# Patient Record
Sex: Female | Born: 1965 | Race: White | Hispanic: No | Marital: Married | Smoking: Former smoker
Health system: Southern US, Community
[De-identification: ages and names within clinical notes are randomized; demographics above are authoritative.]

## PROBLEM LIST (undated history)

## (undated) DIAGNOSIS — F32A Depression, unspecified: Secondary | ICD-10-CM

## (undated) DIAGNOSIS — Z1371 Encounter for nonprocreative screening for genetic disease carrier status: Secondary | ICD-10-CM

## (undated) DIAGNOSIS — F329 Major depressive disorder, single episode, unspecified: Secondary | ICD-10-CM

## (undated) DIAGNOSIS — N938 Other specified abnormal uterine and vaginal bleeding: Secondary | ICD-10-CM

## (undated) DIAGNOSIS — Z9189 Other specified personal risk factors, not elsewhere classified: Secondary | ICD-10-CM

## (undated) DIAGNOSIS — D219 Benign neoplasm of connective and other soft tissue, unspecified: Secondary | ICD-10-CM

## (undated) DIAGNOSIS — Z9289 Personal history of other medical treatment: Secondary | ICD-10-CM

## (undated) DIAGNOSIS — L309 Dermatitis, unspecified: Secondary | ICD-10-CM

## (undated) DIAGNOSIS — E559 Vitamin D deficiency, unspecified: Secondary | ICD-10-CM

## (undated) DIAGNOSIS — Z803 Family history of malignant neoplasm of breast: Secondary | ICD-10-CM

## (undated) DIAGNOSIS — N946 Dysmenorrhea, unspecified: Secondary | ICD-10-CM

## (undated) DIAGNOSIS — F419 Anxiety disorder, unspecified: Secondary | ICD-10-CM

## (undated) DIAGNOSIS — N920 Excessive and frequent menstruation with regular cycle: Secondary | ICD-10-CM

## (undated) HISTORY — DX: Dermatitis, unspecified: L30.9

## (undated) HISTORY — PX: TONSILLECTOMY: SUR1361

## (undated) HISTORY — DX: Benign neoplasm of connective and other soft tissue, unspecified: D21.9

## (undated) HISTORY — DX: Encounter for nonprocreative screening for genetic disease carrier status: Z13.71

## (undated) HISTORY — DX: Dysmenorrhea, unspecified: N94.6

## (undated) HISTORY — DX: Excessive and frequent menstruation with regular cycle: N92.0

## (undated) HISTORY — DX: Personal history of other medical treatment: Z92.89

## (undated) HISTORY — DX: Family history of malignant neoplasm of breast: Z80.3

---
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
## 1999-07-16 ENCOUNTER — Emergency Department (HOSPITAL_COMMUNITY): Admission: EM | Admit: 1999-07-16 | Discharge: 1999-07-16 | Payer: Self-pay | Admitting: Emergency Medicine

## 1999-07-16 ENCOUNTER — Encounter: Payer: Self-pay | Admitting: Emergency Medicine

## 1999-07-30 ENCOUNTER — Other Ambulatory Visit: Admission: RE | Admit: 1999-07-30 | Discharge: 1999-07-30 | Payer: Self-pay | Admitting: Obstetrics and Gynecology

## 2007-11-08 ENCOUNTER — Ambulatory Visit: Payer: Self-pay | Admitting: Internal Medicine

## 2008-04-16 ENCOUNTER — Emergency Department: Payer: Self-pay | Admitting: Unknown Physician Specialty

## 2008-05-22 ENCOUNTER — Ambulatory Visit: Payer: Self-pay | Admitting: Obstetrics and Gynecology

## 2008-05-29 ENCOUNTER — Ambulatory Visit: Payer: Self-pay | Admitting: Obstetrics and Gynecology

## 2008-05-29 HISTORY — PX: DILATION AND CURETTAGE OF UTERUS: SHX78

## 2010-02-19 ENCOUNTER — Emergency Department: Payer: Self-pay | Admitting: Emergency Medicine

## 2011-04-19 ENCOUNTER — Ambulatory Visit: Payer: Self-pay

## 2012-02-27 DIAGNOSIS — L309 Dermatitis, unspecified: Secondary | ICD-10-CM

## 2012-02-27 HISTORY — DX: Dermatitis, unspecified: L30.9

## 2012-12-05 DIAGNOSIS — Z1371 Encounter for nonprocreative screening for genetic disease carrier status: Secondary | ICD-10-CM

## 2012-12-05 HISTORY — DX: Encounter for nonprocreative screening for genetic disease carrier status: Z13.71

## 2013-04-04 DIAGNOSIS — Z803 Family history of malignant neoplasm of breast: Secondary | ICD-10-CM

## 2013-04-04 HISTORY — DX: Family history of malignant neoplasm of breast: Z80.3

## 2013-05-01 HISTORY — PX: INTRAUTERINE DEVICE (IUD) INSERTION: SHX5877

## 2014-05-13 DIAGNOSIS — Z9289 Personal history of other medical treatment: Secondary | ICD-10-CM

## 2014-05-13 HISTORY — DX: Personal history of other medical treatment: Z92.89

## 2015-01-14 ENCOUNTER — Ambulatory Visit: Payer: Self-pay | Admitting: Nurse Practitioner

## 2015-09-17 ENCOUNTER — Encounter: Payer: Self-pay | Admitting: *Deleted

## 2015-09-18 ENCOUNTER — Encounter
Admission: RE | Disposition: A | Discharge status: Home / Self Care | Payer: Self-pay | Source: Ambulatory Visit | Attending: Gastroenterology

## 2015-09-18 ENCOUNTER — Ambulatory Visit: Payer: Commercial Managed Care - HMO | Admitting: Anesthesiology

## 2015-09-18 ENCOUNTER — Ambulatory Visit
Admission: RE | Admit: 2015-09-18 | Discharge: 2015-09-18 | Disposition: A | Discharge status: Home / Self Care | Payer: Commercial Managed Care - HMO | Source: Ambulatory Visit | Attending: Gastroenterology | Admitting: Gastroenterology

## 2015-09-18 DIAGNOSIS — F329 Major depressive disorder, single episode, unspecified: Secondary | ICD-10-CM | POA: Diagnosis not present

## 2015-09-18 DIAGNOSIS — Z87891 Personal history of nicotine dependence: Secondary | ICD-10-CM | POA: Insufficient documentation

## 2015-09-18 DIAGNOSIS — E559 Vitamin D deficiency, unspecified: Secondary | ICD-10-CM | POA: Insufficient documentation

## 2015-09-18 DIAGNOSIS — Z79899 Other long term (current) drug therapy: Secondary | ICD-10-CM | POA: Insufficient documentation

## 2015-09-18 DIAGNOSIS — Z1211 Encounter for screening for malignant neoplasm of colon: Secondary | ICD-10-CM | POA: Diagnosis not present

## 2015-09-18 DIAGNOSIS — Z881 Allergy status to other antibiotic agents status: Secondary | ICD-10-CM | POA: Diagnosis not present

## 2015-09-18 DIAGNOSIS — F419 Anxiety disorder, unspecified: Secondary | ICD-10-CM | POA: Diagnosis not present

## 2015-09-18 DIAGNOSIS — K5730 Diverticulosis of large intestine without perforation or abscess without bleeding: Secondary | ICD-10-CM | POA: Insufficient documentation

## 2015-09-18 DIAGNOSIS — I10 Essential (primary) hypertension: Secondary | ICD-10-CM | POA: Diagnosis not present

## 2015-09-18 HISTORY — DX: Other specified abnormal uterine and vaginal bleeding: N93.8

## 2015-09-18 HISTORY — DX: Anxiety disorder, unspecified: F41.9

## 2015-09-18 HISTORY — DX: Depression, unspecified: F32.A

## 2015-09-18 HISTORY — PX: COLONOSCOPY WITH PROPOFOL: SHX5780

## 2015-09-18 HISTORY — DX: Major depressive disorder, single episode, unspecified: F32.9

## 2015-09-18 HISTORY — DX: Vitamin D deficiency, unspecified: E55.9

## 2015-09-18 LAB — POCT PREGNANCY, URINE: PREG TEST UR: NEGATIVE

## 2015-09-18 SURGERY — COLONOSCOPY WITH PROPOFOL
Anesthesia: General

## 2015-09-18 MED ORDER — LIDOCAINE HCL (CARDIAC) 20 MG/ML IV SOLN
INTRAVENOUS | Status: DC | PRN
  Administered 2015-09-18: 80 mg via INTRAVENOUS

## 2015-09-18 MED ORDER — PROPOFOL 500 MG/50ML IV EMUL
INTRAVENOUS | Status: DC | PRN
  Administered 2015-09-18: 160 ug/kg/min via INTRAVENOUS

## 2015-09-18 MED ORDER — MIDAZOLAM HCL 2 MG/2ML IJ SOLN
INTRAMUSCULAR | Status: DC | PRN
  Administered 2015-09-18: 2 mg via INTRAVENOUS

## 2015-09-18 MED ORDER — SODIUM CHLORIDE 0.9 % IV SOLN
INTRAVENOUS | Status: DC
  Administered 2015-09-18: 1000 mL via INTRAVENOUS

## 2015-09-18 NOTE — Anesthesia Postprocedure Evaluation (Signed)
  Anesthesia Post-op Note  Patient: Julie White  Procedure(s) Performed: Procedure(s): COLONOSCOPY WITH PROPOFOL (N/A)  Anesthesia type:General  Patient location: PACU  Post pain: Pain level controlled  Post assessment: Post-op Vital signs reviewed, Patient's Cardiovascular Status Stable, Respiratory Function Stable, Patent Airway and No signs of Nausea or vomiting  Post vital signs: Reviewed and stable  Last Vitals:  Filed Vitals:   09/18/15 1006  BP: 146/81  Pulse: 72  Temp:   Resp: 19    Level of consciousness: awake, alert  and patient cooperative  Complications: No apparent anesthesia complications

## 2015-09-18 NOTE — Discharge Instructions (Signed)

## 2015-09-18 NOTE — Transfer of Care (Signed)
Immediate Anesthesia Transfer of Care Note  Patient: Julie White  Procedure(s) Performed: Procedure(s): COLONOSCOPY WITH PROPOFOL (N/A)  Patient Location: PACU and Endoscopy Unit  Anesthesia Type:General  Level of Consciousness: sedated  Airway & Oxygen Therapy: Patient Spontanous Breathing and Patient connected to nasal cannula oxygen  Post-op Assessment: Report given to RN and Post -op Vital signs reviewed and stable  Post vital signs: Reviewed  Last Vitals:  Filed Vitals:   09/18/15 0819  BP: 141/79  Pulse: 82  Temp: 37.2 C  Resp: 18    Complications: No apparent anesthesia complications

## 2015-09-18 NOTE — Op Note (Signed)
[facility] Gastroenterology Patient Name: Julie White Procedure Date: 09/18/2015 8:58 AM MRN: [id number] Account #: [id number] Date of Birth: January 03, 1966 Admit Type: Outpatient Age: 49 Room: [facility] ENDO ROOM 1 Gender: Female Note Status: Finalized Procedure:         Colonoscopy Indications:       Screening for colorectal malignant neoplasm, This is the                     patient's first colonoscopy Patient Profile:   This is a 49 year old female. Providers:         Gerrit Heck. Rayann Heman, MD Referring MD:      Willaim Bane. Copland Jaclyn Prime, MD (Referring MD) Medicines:         Propofol per Anesthesia Complications:     No immediate complications. Procedure:         Pre-Anesthesia Assessment:                    - Prior to the procedure, a History and Physical was                     performed, and patient medications, allergies and                     sensitivities were reviewed. The patient's tolerance of                     previous anesthesia was reviewed.                    After obtaining informed consent, the colonoscope was                     passed under direct vision. Throughout the procedure, the                     patient's blood pressure, pulse, and oxygen saturations                     were monitored continuously. The Olympus CF-Q160AL                     colonoscope (S#. [id number]) was introduced through the anus                     and advanced to the the cecum, identified by appendiceal                     orifice and ileocecal valve. The colonoscopy was somewhat                     difficult due to multiple diverticula in the colon.                     Successful completion of the procedure was aided by                     withdrawing the scope and replacing with the pediatric                     colonoscope. The patient tolerated the procedure well. The                     quality of the bowel preparation was excellent. Findings:      The  perianal and  digital rectal examinations were normal.      Many small and large-mouthed diverticula were found in the sigmoid colon.      The exam was otherwise without abnormality on direct and retroflexion       views. Impression:        - Diverticulosis in the sigmoid colon.                    - The examination was otherwise normal on direct and                     retroflexion views.                    - No specimens collected. Recommendation:    - Observe patient in GI recovery unit.                    - High fiber diet.                    - Continue present medications.                    - Repeat colonoscopy in 10 years for screening purposes.                    - Return to referring physician.                    - The findings and recommendations were discussed with the                     patient.                    - The findings and recommendations were discussed with the                     patient's family. Procedure Code(s): --- Professional ---                    [phone], Colonoscopy, flexible; diagnostic, including                     collection of specimen(s) by brushing or washing, when                     performed (separate procedure) CPT copyright 2014 American Medical Association. All rights reserved. The codes documented in this report are preliminary and upon coder review may  be revised to meet current compliance requirements. Mellody Life, MD 09/18/2015 9:35:26 AM This report has been signed electronically. Number of Addenda: 0 Note Initiated On: 09/18/2015 8:58 AM Scope Withdrawal Time: 0 hours 10 minutes 3 seconds  Total Procedure Duration: 0 hours 22 minutes 18 seconds       [facility]

## 2015-09-18 NOTE — H&P (Signed)
  Primary Care Physician:  No PCP Per Patient  Pre-Procedure History & Physical: HPI:  Chanler S Mcadam is a 49 y.o. female is here for an colonoscopy.   Past Medical History  Diagnosis Date  . Anxiety   . Depression   . Vitamin D deficiency   . Dysfunctional uterine bleeding     Past Surgical History  Procedure Laterality Date  . Dilation and curettage of uterus    . Tonsillectomy      Prior to Admission medications   Medication Sig Start Date End Date Taking? Authorizing Provider  Cholecalciferol (VITAMIN D3) 2000 UNITS TABS Take 2,000 Units by mouth daily.   Yes Historical Provider, MD  citalopram (CELEXA) 20 MG tablet Take 20 mg by mouth daily. Two 20 mg. Tablets daily   Yes Historical Provider, MD  hydrochlorothiazide (HYDRODIURIL) 12.5 MG tablet Take 12.5 mg by mouth daily.   Yes Historical Provider, MD  Naltrexone-Bupropion HCl ER 8-90 MG TB12 Take 8-90 mg by mouth 2 (two) times daily. Take 2 tablets by mouth bid   Yes Historical Provider, MD  norethindrone (MICRONOR,CAMILA,ERRIN) 0.35 MG tablet Take 1 tablet by mouth daily.   Yes Historical Provider, MD    Allergies as of 08/11/2015  . (Not on File)    History reviewed. No pertinent family history.  Social History   Social History  . Marital Status: Married    Spouse Name: N/A  . Number of Children: N/A  . Years of Education: N/A   Occupational History  . Not on file.   Social History Main Topics  . Smoking status: Former Research scientist (life sciences)  . Smokeless tobacco: Not on file  . Alcohol Use: Not on file  . Drug Use: Not on file  . Sexual Activity: Not on file   Other Topics Concern  . Not on file   Social History Narrative     Physical Exam: BP 141/79 mmHg  Pulse 82  Temp(Src) 98.9 F (37.2 C) (Tympanic)  Resp 18  Ht 5\' 6"  (1.676 m)  Wt 113.399 kg (250 lb)  BMI 40.37 kg/m2  SpO2 97% General:   Alert,  pleasant and cooperative in NAD Head:  Normocephalic and atraumatic. Neck:  Supple; no masses or  thyromegaly. Lungs:  Clear throughout to auscultation.    Heart:  Regular rate and rhythm. Abdomen:  Soft, nontender and nondistended. Normal bowel sounds, without guarding, and without rebound.   Neurologic:  Alert and  oriented x4;  grossly normal neurologically.  Impression/Plan: Leisl S Harshfield is here for an colonoscopy to be performed for screening  Risks, benefits, limitations, and alternatives regarding  colonoscopy have been reviewed with the patient.  Questions have been answered.  All parties agreeable.   Josefine Class, MD  09/18/2015, 8:56 AM

## 2015-09-18 NOTE — Anesthesia Preprocedure Evaluation (Signed)
Anesthesia Evaluation  Patient identified by MRN, date of birth, ID band Patient awake    Reviewed: Allergy & Precautions, NPO status , Patient's Chart, lab work & pertinent test results  History of Anesthesia Complications Negative for: history of anesthetic complications  Airway Mallampati: III       Dental  (+) Teeth Intact   Pulmonary neg pulmonary ROS, former smoker,           Cardiovascular hypertension, Pt. on medications negative cardio ROS       Neuro/Psych negative neurological ROS     GI/Hepatic negative GI ROS, Neg liver ROS,   Endo/Other  negative endocrine ROS  Renal/GU negative Renal ROS     Musculoskeletal   Abdominal   Peds  Hematology negative hematology ROS (+)   Anesthesia Other Findings   Reproductive/Obstetrics                             Anesthesia Physical Anesthesia Plan  ASA: II  Anesthesia Plan: General   Post-op Pain Management:    Induction: Intravenous  Airway Management Planned: Nasal Cannula  Additional Equipment:   Intra-op Plan:   Post-operative Plan:   Informed Consent: I have reviewed the patients History and Physical, chart, labs and discussed the procedure including the risks, benefits and alternatives for the proposed anesthesia with the patient or authorized representative who has indicated his/her understanding and acceptance.     Plan Discussed with:   Anesthesia Plan Comments:         Anesthesia Quick Evaluation

## 2015-09-23 ENCOUNTER — Encounter: Payer: Self-pay | Admitting: Gastroenterology

## 2016-12-16 ENCOUNTER — Ambulatory Visit: Admission: EM | Admit: 2016-12-16 | Discharge: 2016-12-16 | Discharge status: Absent without leave | Payer: 59

## 2017-02-01 ENCOUNTER — Ambulatory Visit (INDEPENDENT_AMBULATORY_CARE_PROVIDER_SITE_OTHER): Payer: 59 | Admitting: Obstetrics and Gynecology

## 2017-02-01 ENCOUNTER — Encounter: Payer: Self-pay | Admitting: Obstetrics and Gynecology

## 2017-02-01 VITALS — BP 120/78 | Ht 66.0 in | Wt 254.0 lb

## 2017-02-01 DIAGNOSIS — Z713 Dietary counseling and surveillance: Secondary | ICD-10-CM | POA: Diagnosis not present

## 2017-02-01 DIAGNOSIS — Z6841 Body Mass Index (BMI) 40.0 and over, adult: Secondary | ICD-10-CM

## 2017-02-01 DIAGNOSIS — IMO0001 Reserved for inherently not codable concepts without codable children: Secondary | ICD-10-CM | POA: Insufficient documentation

## 2017-02-01 DIAGNOSIS — E6609 Other obesity due to excess calories: Secondary | ICD-10-CM | POA: Diagnosis not present

## 2017-02-01 DIAGNOSIS — I10 Essential (primary) hypertension: Secondary | ICD-10-CM

## 2017-02-01 MED ORDER — NALTREXONE-BUPROPION HCL ER 8-90 MG PO TB12: 8.0000 mg | ORAL_TABLET | Freq: Two times a day (BID) | ORAL | 0 refills | Status: DC

## 2017-02-01 MED ORDER — HYDROCHLOROTHIAZIDE 12.5 MG PO TABS: 12.5000 mg | ORAL_TABLET | Freq: Every day | ORAL | 3 refills | Status: DC

## 2017-02-01 NOTE — Progress Notes (Signed)
HPI:  Pt here to f/u on BP from 01/03/17 appt. BP was 142/92 at that visit. She was out of Rx HCTZ  RF and was taking med sparingly. She has restarted HCTZ daily and doing well without side effects. Her CMP was WNL at that visit.  Pt also here for wt check from 01/03/17. Wt was 260#.  Pt restarted contrave at that appt. She did it in the past without side effects. She is doing well with it. She denies any psych changes. She has lost 6#. She has decreased appetite. She has not started exercise yet but is trying to eat healthier. She would like to cont Rx. She had mild pre-DM (HgA1C 5.7%) 01/03/17. She has considered bariatric surg.  She is taking Vit D supp.  Review of Systems  Constitutional: Positive for weight loss. Negative for fever.  Neurological: Negative for dizziness and headaches.  Psychiatric/Behavioral: Negative for depression and suicidal ideas. The patient is not nervous/anxious.    Vitals:   BP 120/78 (BP Location: Right Arm, Patient Position: Sitting)   Ht 5\' 6"  (1.676 m)   Wt 254 lb (115.2 kg)   BMI 41.00 kg/m    Assessment/Plan:  Essential hypertension - BPs controlled with HCTZ. Rx RF for 1 yr. F/u in 3 months. - Plan: Comprehensive metabolic panel  Class 3 obesity due to excess calories without serious comorbidity with body mass index (BMI) of 40.0 to 44.9 in adult [facility]) - Plan: Comprehensive metabolic panel, Hemoglobin A1c  Encounter for weight loss counseling - Pt doing well with contrave. Rx RF. Add exercise/diet changes. Rechk labs at 3 mo f/u. - Plan: Hemoglobin A1c  Return in about 3 months (around 05/01/2017) for wt and BP check.  And labs.  Meds ordered this encounter  Medications  . hydrochlorothiazide (HYDRODIURIL) 12.5 MG tablet    Sig: Take 1 tablet (12.5 mg total) by mouth daily.    Dispense:  90 tablet    Refill:  3  . Naltrexone-Bupropion HCl ER 8-90 MG TB12    Sig: Take 8-90 mg by mouth 2 (two) times daily. Take 2 tablets by mouth bid    Dispense:   180 tablet    Refill:  0

## 2017-02-06 ENCOUNTER — Other Ambulatory Visit: Payer: Self-pay | Admitting: Obstetrics and Gynecology

## 2017-02-06 MED ORDER — NALTREXONE-BUPROPION HCL ER 8-90 MG PO TB12: 8.0000 mg | ORAL_TABLET | Freq: Two times a day (BID) | ORAL | 0 refills | Status: DC

## 2017-02-08 ENCOUNTER — Other Ambulatory Visit: Payer: Self-pay | Admitting: Obstetrics and Gynecology

## 2017-02-08 ENCOUNTER — Telehealth: Payer: Self-pay | Admitting: Obstetrics and Gynecology

## 2017-02-08 MED ORDER — NALTREXONE-BUPROPION HCL ER 8-90 MG PO TB12: 8.0000 mg | ORAL_TABLET | Freq: Two times a day (BID) | ORAL | 0 refills | Status: DC

## 2017-02-08 NOTE — Telephone Encounter (Signed)
Patient called and stated that her Contrave needs to be called in to [facility] [place], due to she has a discount card and Mail order will not honor it.  Please advise patient at CB# [phone]

## 2017-05-04 ENCOUNTER — Ambulatory Visit: Payer: 59 | Admitting: Obstetrics and Gynecology

## 2017-12-28 ENCOUNTER — Other Ambulatory Visit: Payer: Self-pay | Admitting: Obstetrics and Gynecology

## 2018-03-18 ENCOUNTER — Other Ambulatory Visit: Payer: Self-pay | Admitting: Obstetrics and Gynecology

## 2019-03-10 ENCOUNTER — Encounter: Payer: Self-pay | Admitting: Obstetrics and Gynecology

## 2019-03-11 ENCOUNTER — Other Ambulatory Visit: Payer: Self-pay | Admitting: Obstetrics and Gynecology

## 2019-03-12 ENCOUNTER — Other Ambulatory Visit: Payer: Self-pay | Admitting: Obstetrics and Gynecology

## 2019-03-12 MED ORDER — CITALOPRAM HYDROBROMIDE 40 MG PO TABS: 40.0000 mg | ORAL_TABLET | Freq: Every day | ORAL | 0 refills | Status: DC

## 2019-03-12 NOTE — Progress Notes (Signed)
Rx RF citalopram until 6/20 annual.

## 2019-04-04 ENCOUNTER — Other Ambulatory Visit: Payer: Self-pay | Admitting: Obstetrics and Gynecology

## 2019-05-04 ENCOUNTER — Other Ambulatory Visit: Payer: Self-pay | Admitting: Obstetrics and Gynecology

## 2019-05-06 DIAGNOSIS — Z9189 Other specified personal risk factors, not elsewhere classified: Secondary | ICD-10-CM

## 2019-05-06 HISTORY — DX: Other specified personal risk factors, not elsewhere classified: Z91.89

## 2019-05-07 ENCOUNTER — Encounter: Payer: Self-pay | Admitting: Obstetrics and Gynecology

## 2019-05-07 NOTE — Patient Instructions (Signed)
I value your feedback and entrusting us with your care. If you get a [facility] patient survey, I would appreciate you taking the time to let us know about your experience today. Thank you! 

## 2019-05-07 NOTE — Progress Notes (Signed)
PCP: Patient, No Pcp Per   Chief Complaint  Patient presents with  . Gynecologic Exam    would like advise/refer to someone regarding her right arm, cannot straighten it all the way up, hurts to lay on it x few months    HPI:      Ms. Julie White is a 53 y.o. G0P0000 who LMP was Patient's last menstrual period was 05/22/2018 (approximate)., presents today for her annual examination.  Her menses are absent due to perimenopause. LMP about 11-12 months ago. She does not have vasomotor sx.   Sex activity: single partner, contraception - post menopausal status. She does have vaginal dryness and uses lubricants with relief.  Last Pap: January 03, 2017  Results were: no abnormalities /neg HPV DNA.  Hx of STDs: none  Last mammogram: May 18, 2015  Results were: normal--routine follow-up in 12 months There is a FH of breast cancer in 2 mat aunts and 3 mat cousins. Pt is BRCA/BART neg 2014, IBIS=19%. Update testing discussed and declined in past. There is no FH of ovarian cancer. The patient does do self-breast exams.  Colonoscopy: 2016 without abnormalities;  Repeat due after 10 years.   Tobacco use: The patient denies current or previous tobacco use. Alcohol use: social drinker Exercise: not active  She does get adequate calcium and Vitamin D in her diet.  Has anxiety and takes citalopram 40 mg daily with sx relief. Has work/fam stressors. Also has a hx of HTN and took HCTZ in past, but hasn't been taking recently and BP has been normal. Pt checks Bps at home.   No recent labs.   Pt has limited ROM and pain in RT shoulder for several months. Sx started after swinging a beach bag over her shoulder. She noticed a "catch" and then it hurt for a few days, but never returned to normal. Sx wax and wane. Hurts to sleep on either side and that worsens shoulder sx.   Past Medical History:  Diagnosis Date  . Anxiety   . BRCA negative 2014   BRCA/BART neg through [facility]  . Depression   .  Dysfunctional uterine bleeding   . Dysmenorrhea   . Eczema 02/27/2012  . Family history of breast cancer 04/2013   BRCA neg; IBIS-19%  . Fibroid   . History of mammogram 05/15/2014 BI-RADS II  . History of Papanicolaou smear of cervix 05/13/2014  . Menorrhagia   . Vitamin D deficiency     Past Surgical History:  Procedure Laterality Date  . COLONOSCOPY WITH PROPOFOL N/A 09/18/2015   Procedure: COLONOSCOPY WITH PROPOFOL;  Surgeon: Josefine Class, MD;  Location: [facility] ENDOSCOPY;  Service: Endoscopy;  Laterality: N/A;  . DILATION AND CURETTAGE OF UTERUS  05/29/2008   DUB; FIBROIDS  . INTRAUTERINE DEVICE (IUD) INSERTION  05/01/2013   LAW  . TONSILLECTOMY      Family History  Problem Relation Age of Onset  . Parkinson's disease Father   . Diabetes Brother   . Breast cancer Maternal Aunt   . Breast cancer Maternal Aunt   . Breast cancer Cousin   . Breast cancer Cousin   . Breast cancer Cousin   . Hypertension Neg Hx   . Cancer Neg Hx     Social History   Socioeconomic History  . Marital status: Married    Spouse name: Not on file  . Number of children: Not on file  . Years of education: Not on file  . Highest education level:  Not on file  Occupational History  . Not on file  Social Needs  . Financial resource strain: Not on file  . Food insecurity:    Worry: Not on file    Inability: Not on file  . Transportation needs:    Medical: Not on file    Non-medical: Not on file  Tobacco Use  . Smoking status: Former Research scientist (life sciences)  . Smokeless tobacco: Never Used  Substance and Sexual Activity  . Alcohol use: Yes    Comment: occ  . Drug use: No  . Sexual activity: Yes    Birth control/protection: None  Lifestyle  . Physical activity:    Days per week: Not on file    Minutes per session: Not on file  . Stress: Not on file  Relationships  . Social connections:    Talks on phone: Not on file    Gets together: Not on file    Attends religious service: Not on file     Active member of club or organization: Not on file    Attends meetings of clubs or organizations: Not on file    Relationship status: Not on file  . Intimate partner violence:    Fear of current or ex partner: Not on file    Emotionally abused: Not on file    Physically abused: Not on file    Forced sexual activity: Not on file  Other Topics Concern  . Not on file  Social History Narrative  . Not on file    Current Outpatient Medications on File Prior to Visit  Medication Sig Dispense Refill  . Cholecalciferol (VITAMIN D3) 2000 UNITS TABS Take 2,000 Units by mouth daily.    . hydrochlorothiazide (HYDRODIURIL) 12.5 MG tablet Take 1 tablet (12.5 mg total) by mouth daily. (Patient not taking: Reported on 05/08/2019) 90 tablet 3   No current facility-administered medications on file prior to visit.     ROS:  Review of Systems  Constitutional: Negative for fatigue, fever and unexpected weight change.  Respiratory: Negative for cough, shortness of breath and wheezing.   Cardiovascular: Negative for chest pain, palpitations and leg swelling.  Gastrointestinal: Negative for blood in stool, constipation, diarrhea, nausea and vomiting.  Endocrine: Negative for cold intolerance, heat intolerance and polyuria.  Genitourinary: Negative for dyspareunia, dysuria, flank pain, frequency, genital sores, hematuria, menstrual problem, pelvic pain, urgency, vaginal bleeding, vaginal discharge and vaginal pain.  Musculoskeletal: Positive for arthralgias. Negative for back pain, joint swelling and myalgias.  Skin: Negative for rash.  Neurological: Negative for dizziness, syncope, light-headedness, numbness and headaches.  Hematological: Negative for adenopathy.  Psychiatric/Behavioral: Negative for agitation, confusion, sleep disturbance and suicidal ideas. The patient is not nervous/anxious.   BREAST: No symptoms   Objective: BP 134/90   Ht _0  (1.676 m)   Wt 236 lb (107 kg)   LMP 05/22/2018  (Approximate)   BMI 38.09 kg/m    Physical Exam Constitutional:      Appearance: She is well-developed.  Genitourinary:     Vulva, vagina, cervix, uterus, right adnexa and left adnexa normal.     No vulval lesion or tenderness noted.     No vaginal discharge, erythema or tenderness.     No cervical polyp.     Uterus is not enlarged or tender.     No right or left adnexal mass present.     Right adnexa not tender.     Left adnexa not tender.  Neck:     Musculoskeletal: Normal  range of motion.     Thyroid: No thyromegaly.  Cardiovascular:     Rate and Rhythm: Normal rate and regular rhythm.     Heart sounds: Normal heart sounds. No murmur.  Pulmonary:     Effort: Pulmonary effort is normal.     Breath sounds: Normal breath sounds.  Chest:     Breasts:        Right: No mass, nipple discharge, skin change or tenderness.        Left: No mass, nipple discharge, skin change or tenderness.  Abdominal:     Palpations: Abdomen is soft.     Tenderness: There is no abdominal tenderness. There is no guarding.  Musculoskeletal:     Right shoulder: She exhibits decreased range of motion and tenderness.  Neurological:     General: No focal deficit present.     Mental Status: She is alert and oriented to person, place, and time.     Cranial Nerves: No cranial nerve deficit.  Skin:    General: Skin is warm and dry.  Psychiatric:        Mood and Affect: Mood normal.        Behavior: Behavior normal.        Thought Content: Thought content normal.        Judgment: Judgment normal.  Vitals signs reviewed.     Assessment/Plan:  Encounter for annual routine gynecological examination  Screening for breast cancer - Pt to sched mammo. - Plan: MM 3D SCREEN BREAST BILATERAL  Family history of breast cancer - BRCA testing neg in past. Update testing discussed and done today. Will call with results.  - Plan: [facility])  Essential hypertension -  Resolved. Pt to cont to check BPs and f/u if >140/90.   Anxiety - Sx controlled with citalopram. Rx RF. F/u prn.  - Plan: citalopram (CELEXA) 40 MG tablet  Injury of right shoulder, initial encounter - Pt to f/u with PCP for initial eval. May need ortho ref prn.  Blood tests for routine general physical examination - Plan: Comprehensive metabolic panel, Lipid panel, Hemoglobin A1c  Screening cholesterol level - Plan: Lipid panel  Screening for diabetes mellitus - Plan: Hemoglobin A1c  BMI 38.0-38.9,adult - Trying to lose wt with diet changes. Has done contrave in the past.  - Plan: Lipid panel, Hemoglobin A1c   Meds ordered this encounter  Medications  . citalopram (CELEXA) 40 MG tablet    Sig: Take 1 tablet (40 mg total) by mouth daily.    Dispense:  90 tablet    Refill:  3    Order Specific Question:   Supervising Provider    Answer:   Gae Dry [[id number]]            GYN counsel breast self exam, mammography screening, menopause, adequate intake of calcium and vitamin D, diet and exercise    F/U  Return in about 1 year (around 05/07/2020).  Markes Shatswell B. Lige Lakeman, PA-C 05/08/2019 8:56 AM

## 2019-05-08 ENCOUNTER — Encounter: Payer: Self-pay | Admitting: Obstetrics and Gynecology

## 2019-05-08 ENCOUNTER — Ambulatory Visit (INDEPENDENT_AMBULATORY_CARE_PROVIDER_SITE_OTHER): Payer: BC Managed Care – PPO | Admitting: Obstetrics and Gynecology

## 2019-05-08 ENCOUNTER — Telehealth: Payer: Self-pay

## 2019-05-08 ENCOUNTER — Other Ambulatory Visit: Payer: Self-pay

## 2019-05-08 ENCOUNTER — Other Ambulatory Visit: Payer: Self-pay | Admitting: Obstetrics and Gynecology

## 2019-05-08 VITALS — BP 134/90 | Ht 66.0 in | Wt 236.0 lb

## 2019-05-08 DIAGNOSIS — Z01419 Encounter for gynecological examination (general) (routine) without abnormal findings: Secondary | ICD-10-CM

## 2019-05-08 DIAGNOSIS — F419 Anxiety disorder, unspecified: Secondary | ICD-10-CM

## 2019-05-08 DIAGNOSIS — Z0000 Encounter for general adult medical examination without abnormal findings: Secondary | ICD-10-CM

## 2019-05-08 DIAGNOSIS — Z803 Family history of malignant neoplasm of breast: Secondary | ICD-10-CM

## 2019-05-08 DIAGNOSIS — Z131 Encounter for screening for diabetes mellitus: Secondary | ICD-10-CM

## 2019-05-08 DIAGNOSIS — Z1239 Encounter for other screening for malignant neoplasm of breast: Secondary | ICD-10-CM

## 2019-05-08 DIAGNOSIS — Z6838 Body mass index (BMI) 38.0-38.9, adult: Secondary | ICD-10-CM

## 2019-05-08 DIAGNOSIS — Z13220 Encounter for screening for lipoid disorders: Secondary | ICD-10-CM | POA: Diagnosis not present

## 2019-05-08 DIAGNOSIS — I10 Essential (primary) hypertension: Secondary | ICD-10-CM

## 2019-05-08 DIAGNOSIS — S4991XA Unspecified injury of right shoulder and upper arm, initial encounter: Secondary | ICD-10-CM

## 2019-05-08 MED ORDER — CITALOPRAM HYDROBROMIDE 40 MG PO TABS: 40.0000 mg | ORAL_TABLET | Freq: Every day | ORAL | 3 refills | Status: DC

## 2019-05-08 NOTE — Telephone Encounter (Signed)
Pt filled out [facility] paperwork this morning, but forgot write down on second page site of cancer for each family member. I need to confirm sites.

## 2019-05-08 NOTE — Telephone Encounter (Signed)
Pt called back and said all are Breast Cancer sites.

## 2019-05-09 LAB — COMPREHENSIVE METABOLIC PANEL
ALT: 15 IU/L (ref 0–32)
AST: 19 IU/L (ref 0–40)
Albumin/Globulin Ratio: 1.7 (ref 1.2–2.2)
Albumin: 4.5 g/dL (ref 3.8–4.9)
Alkaline Phosphatase: 86 IU/L (ref 39–117)
BUN/Creatinine Ratio: 25 — ABNORMAL HIGH (ref 9–23)
BUN: 14 mg/dL (ref 6–24)
Bilirubin Total: 0.3 mg/dL (ref 0.0–1.2)
CO2: 25 mmol/L (ref 20–29)
Calcium: 9.5 mg/dL (ref 8.7–10.2)
Chloride: 100 mmol/L (ref 96–106)
Creatinine, Ser: 0.55 mg/dL — ABNORMAL LOW (ref 0.57–1.00)
GFR calc Af Amer: 123 mL/min/{1.73_m2} (ref >59)
GFR calc non Af Amer: 107 mL/min/{1.73_m2} (ref >59)
Globulin, Total: 2.7 g/dL (ref 1.5–4.5)
Glucose: 92 mg/dL (ref 65–99)
Potassium: 4.8 mmol/L (ref 3.5–5.2)
Sodium: 143 mmol/L (ref 134–144)
Total Protein: 7.2 g/dL (ref 6.0–8.5)

## 2019-05-09 LAB — LIPID PANEL
Chol/HDL Ratio: 4.9 ratio — ABNORMAL HIGH (ref 0.0–4.4)
Cholesterol, Total: 211 mg/dL — ABNORMAL HIGH (ref 100–199)
HDL: 43 mg/dL (ref >39)
LDL Calculated: 147 mg/dL — ABNORMAL HIGH (ref 0–99)
Triglycerides: 105 mg/dL (ref 0–149)
VLDL Cholesterol Cal: 21 mg/dL (ref 5–40)

## 2019-05-09 LAB — HEMOGLOBIN A1C
Est. average glucose Bld gHb Est-mCnc: 108 mg/dL
Hgb A1c MFr Bld: 5.4 % (ref 4.8–5.6)

## 2019-05-16 DIAGNOSIS — I10 Essential (primary) hypertension: Secondary | ICD-10-CM | POA: Diagnosis not present

## 2019-05-16 DIAGNOSIS — F419 Anxiety disorder, unspecified: Secondary | ICD-10-CM | POA: Diagnosis not present

## 2019-05-16 DIAGNOSIS — M25511 Pain in right shoulder: Secondary | ICD-10-CM | POA: Diagnosis not present

## 2019-05-16 DIAGNOSIS — Z0000 Encounter for general adult medical examination without abnormal findings: Secondary | ICD-10-CM | POA: Diagnosis not present

## 2019-05-20 ENCOUNTER — Ambulatory Visit
Admission: RE | Admit: 2019-05-20 | Discharge: 2019-05-20 | Disposition: A | Discharge status: Home / Self Care | Payer: BC Managed Care – PPO | Source: Ambulatory Visit | Attending: Obstetrics and Gynecology | Admitting: Obstetrics and Gynecology

## 2019-05-20 ENCOUNTER — Other Ambulatory Visit: Payer: Self-pay

## 2019-05-20 DIAGNOSIS — Z1239 Encounter for other screening for malignant neoplasm of breast: Secondary | ICD-10-CM

## 2019-05-20 DIAGNOSIS — Z1231 Encounter for screening mammogram for malignant neoplasm of breast: Secondary | ICD-10-CM | POA: Diagnosis not present

## 2019-05-27 ENCOUNTER — Encounter: Payer: Self-pay | Admitting: Obstetrics and Gynecology

## 2019-05-28 ENCOUNTER — Other Ambulatory Visit: Payer: Self-pay | Admitting: Obstetrics and Gynecology

## 2019-05-28 DIAGNOSIS — R928 Other abnormal and inconclusive findings on diagnostic imaging of breast: Secondary | ICD-10-CM

## 2019-05-28 DIAGNOSIS — N6310 Unspecified lump in the right breast, unspecified quadrant: Secondary | ICD-10-CM

## 2019-05-29 ENCOUNTER — Other Ambulatory Visit: Payer: Self-pay | Admitting: Sports Medicine

## 2019-05-29 DIAGNOSIS — M7581 Other shoulder lesions, right shoulder: Secondary | ICD-10-CM | POA: Diagnosis not present

## 2019-05-29 DIAGNOSIS — M25511 Pain in right shoulder: Secondary | ICD-10-CM | POA: Diagnosis not present

## 2019-05-29 DIAGNOSIS — M7541 Impingement syndrome of right shoulder: Secondary | ICD-10-CM

## 2019-05-29 DIAGNOSIS — G8929 Other chronic pain: Secondary | ICD-10-CM

## 2019-05-29 DIAGNOSIS — M778 Other enthesopathies, not elsewhere classified: Secondary | ICD-10-CM

## 2019-05-29 DIAGNOSIS — M19011 Primary osteoarthritis, right shoulder: Secondary | ICD-10-CM

## 2019-05-30 ENCOUNTER — Ambulatory Visit
Admission: RE | Admit: 2019-05-30 | Discharge: 2019-05-30 | Disposition: A | Discharge status: Home / Self Care | Payer: BC Managed Care – PPO | Source: Ambulatory Visit | Attending: Obstetrics and Gynecology | Admitting: Obstetrics and Gynecology

## 2019-05-30 ENCOUNTER — Other Ambulatory Visit: Payer: Self-pay

## 2019-05-30 DIAGNOSIS — N6310 Unspecified lump in the right breast, unspecified quadrant: Secondary | ICD-10-CM | POA: Insufficient documentation

## 2019-05-30 DIAGNOSIS — R928 Other abnormal and inconclusive findings on diagnostic imaging of breast: Secondary | ICD-10-CM | POA: Insufficient documentation

## 2019-05-30 DIAGNOSIS — N6489 Other specified disorders of breast: Secondary | ICD-10-CM | POA: Diagnosis not present

## 2019-06-02 ENCOUNTER — Encounter: Payer: Self-pay | Admitting: Obstetrics and Gynecology

## 2019-07-16 ENCOUNTER — Telehealth: Payer: Self-pay | Admitting: Obstetrics and Gynecology

## 2019-07-16 NOTE — Telephone Encounter (Signed)
Pt aware of neg [facility] update results. IBIS=24.4%/riskscore=26.1%.  Discussed monthly SBE, yearly CBE and mammos, as well as scr breast MRI. Pt had neg mammo 6/20. F/u by 11/20 if wants to sched scr breast MRI this yr.  Patient understands these results only apply to her and her children, and this is not indicative of genetic testing results of her other family members. It is recommended that her other family members have genetic testing done.  Pt also understands negative genetic testing doesn't mean she will never get any of these cancers.   Hard copy mailed to pt. F/u prn.

## 2019-07-16 NOTE — Telephone Encounter (Signed)
Patient is calling for labs results. Please advise. 

## 2020-02-12 ENCOUNTER — Other Ambulatory Visit: Payer: Self-pay

## 2020-02-12 ENCOUNTER — Ambulatory Visit (INDEPENDENT_AMBULATORY_CARE_PROVIDER_SITE_OTHER): Payer: 59 | Admitting: Obstetrics and Gynecology

## 2020-02-12 ENCOUNTER — Encounter: Payer: Self-pay | Admitting: Obstetrics and Gynecology

## 2020-02-12 VITALS — BP 124/80 | Ht 66.0 in | Wt 215.0 lb

## 2020-02-12 DIAGNOSIS — N644 Mastodynia: Secondary | ICD-10-CM

## 2020-02-12 DIAGNOSIS — N950 Postmenopausal bleeding: Secondary | ICD-10-CM | POA: Diagnosis not present

## 2020-02-12 NOTE — Patient Instructions (Signed)
I value your feedback and entrusting us with your care. If you get a  patient survey, I would appreciate you taking the time to let us know about your experience today. Thank you!  As of November 14, 2019, your lab results will be released to your MyChart immediately, before I even have a chance to see them. Please give me time to review them and contact you if there are any abnormalities. Thank you for your patience.  

## 2020-02-12 NOTE — Progress Notes (Signed)
Julie White, Julie Evener, PA-C   Chief Complaint  Patient presents with  . Vaginal Bleeding    had spotting last fri up to yesterday, little cramping. last period was sometime in 2019    HPI:      Julie White is a 54 y.o. G0P0000 who LMP was No LMP recorded. (Menstrual status: Perimenopausal)., presents today for PMB. LMP ~ 1 1/2 yrs ago. Had 5 days light spotting and cramping this past wk, no clots with bleeding. Taking ibup with cramp improvement. Had breast tenderness about 2 wks ago, unusual for pt. No FH ovar/uterine cancer. Pt is MyRisk neg due to FH breast cancer. She is sex active, no pain/bleeding with sex.  Last pap 01/03/17 was neg/neg HPV DNA. Last annual 6/20  Past Medical History:  Diagnosis Date  . Anxiety   . BRCA negative 2014   BRCA/BART neg through [facility]; MyRisk update testing neg 6/20  . Depression   . Dysfunctional uterine bleeding   . Dysmenorrhea   . Eczema 02/27/2012  . Family history of breast cancer 04/2013, 6/20   BRCA neg/MyRisk neg  . Fibroid   . History of mammogram 05/15/2014 BI-RADS II  . History of Papanicolaou smear of cervix 05/13/2014  . Increased risk of breast cancer 05/2019   IBIS=24.4%/riskscore=26.1%  . Menorrhagia   . Vitamin D deficiency     Past Surgical History:  Procedure Laterality Date  . COLONOSCOPY WITH PROPOFOL N/A 09/18/2015   Procedure: COLONOSCOPY WITH PROPOFOL;  Surgeon: Julie Class, MD;  Location: [facility] ENDOSCOPY;  Service: Endoscopy;  Laterality: N/A;  . DILATION AND CURETTAGE OF UTERUS  05/29/2008   DUB; FIBROIDS  . INTRAUTERINE DEVICE (IUD) INSERTION  05/01/2013   LAW  . TONSILLECTOMY      Family History  Problem Relation Age of Onset  . Parkinson's disease Father   . Diabetes Brother   . Breast cancer Maternal Aunt   . Breast cancer Maternal Aunt   . Breast cancer Cousin   . Breast cancer Cousin   . Breast cancer Cousin   . Hypertension Neg Hx   . Cancer Neg Hx     Social History    Socioeconomic History  . Marital status: Married    Spouse name: Not on file  . Number of children: Not on file  . Years of education: Not on file  . Highest education level: Not on file  Occupational History  . Not on file  Tobacco Use  . Smoking status: Former Research scientist (life sciences)  . Smokeless tobacco: Never Used  Substance and Sexual Activity  . Alcohol use: Yes    Comment: occ  . Drug use: No  . Sexual activity: Yes    Birth control/protection: None  Other Topics Concern  . Not on file  Social History Narrative  . Not on file   Social Determinants of Health   Financial Resource Strain:   . Difficulty of Paying Living Expenses: Not on file  Food Insecurity:   . Worried About Charity fundraiser in the Last Year: Not on file  . Ran Out of Food in the Last Year: Not on file  Transportation Needs:   . Lack of Transportation (Medical): Not on file  . Lack of Transportation (Non-Medical): Not on file  Physical Activity:   . Days of Exercise per Week: Not on file  . Minutes of Exercise per Session: Not on file  Stress:   . Feeling of Stress : Not on file  Social Connections:   . Frequency of Communication with Friends and Family: Not on file  . Frequency of Social Gatherings with Friends and Family: Not on file  . Attends Religious Services: Not on file  . Active Member of Clubs or Organizations: Not on file  . Attends Archivist Meetings: Not on file  . Marital Status: Not on file  Intimate Partner Violence:   . Fear of Current or Ex-Partner: Not on file  . Emotionally Abused: Not on file  . Physically Abused: Not on file  . Sexually Abused: Not on file    Outpatient Medications Prior to Visit  Medication Sig Dispense Refill  . Cholecalciferol (VITAMIN D3) 2000 UNITS TABS Take 2,000 Units by mouth daily.    . citalopram (CELEXA) 40 MG tablet Take 1 tablet (40 mg total) by mouth daily. 90 tablet 3  . hydrochlorothiazide (HYDRODIURIL) 12.5 MG tablet Take 1 tablet  (12.5 mg total) by mouth daily. (Patient not taking: Reported on 05/08/2019) 90 tablet 3   No facility-administered medications prior to visit.      ROS:  Review of Systems  Constitutional: Negative for fever.  Gastrointestinal: Negative for blood in stool, constipation, diarrhea, nausea and vomiting.  Genitourinary: Positive for menstrual problem. Negative for dyspareunia, dysuria, flank pain, frequency, hematuria, urgency, vaginal bleeding, vaginal discharge and vaginal pain.  Musculoskeletal: Negative for back pain.  Skin: Negative for rash.  BREAST: tenderness   OBJECTIVE:   Vitals:  BP 124/80   Ht 5' 6"  (1.676 m)   Wt 215 lb (97.5 kg)   BMI 34.70 kg/m   Physical Exam Vitals reviewed.  Constitutional:      Appearance: She is well-developed.  Pulmonary:     Effort: Pulmonary effort is normal.  Genitourinary:    General: Normal vulva.     Pubic Area: No rash.      Labia:        Right: No rash, tenderness or lesion.        Left: No rash, tenderness or lesion.      Vagina: Normal. No vaginal discharge, erythema or tenderness.     Cervix: Normal.     Uterus: Normal. Not enlarged and not tender.      Adnexa: Right adnexa normal and left adnexa normal.       Right: No mass or tenderness.         Left: No mass or tenderness.       Comments: BROWN VAG D/C; NO CX POLYP Musculoskeletal:        General: Normal range of motion.     Cervical back: Normal range of motion.  Skin:    General: Skin is warm and dry.  Neurological:     General: No focal deficit present.     Mental Status: She is alert and oriented to person, place, and time.  Psychiatric:        Mood and Affect: Mood normal.        Behavior: Behavior normal.        Thought Content: Thought content normal.        Judgment: Judgment normal.     Assessment/Plan: PMB (postmenopausal bleeding) - Plan: US PELVIS TRANSVAGINAL NON-OB (TV ONLY), Follicle stimulating hormone, Estradiol; Check labs and GYN u/s. Will  f/u with results. If EM< 71m can follow sx, if EM>4 mm, will do EMB. Also with breast tenderness about 2 wks ago.    Return in about 1 day (around 02/13/2020) for GYN u/s for  PMB--[facility] to call pt.  Julie Carlini B. Chamille Werntz, PA-C 02/12/2020 10:13 AM

## 2020-02-13 LAB — FOLLICLE STIMULATING HORMONE: FSH: 37.8 m[IU]/mL

## 2020-02-13 LAB — ESTRADIOL: Estradiol: 7 pg/mL

## 2020-02-14 ENCOUNTER — Other Ambulatory Visit: Payer: Self-pay

## 2020-02-14 ENCOUNTER — Ambulatory Visit (INDEPENDENT_AMBULATORY_CARE_PROVIDER_SITE_OTHER): Payer: 59

## 2020-02-14 DIAGNOSIS — D251 Intramural leiomyoma of uterus: Secondary | ICD-10-CM

## 2020-02-14 DIAGNOSIS — N950 Postmenopausal bleeding: Secondary | ICD-10-CM

## 2020-07-13 ENCOUNTER — Other Ambulatory Visit: Payer: Self-pay | Admitting: Obstetrics and Gynecology

## 2020-07-13 DIAGNOSIS — F419 Anxiety disorder, unspecified: Secondary | ICD-10-CM

## 2020-07-27 ENCOUNTER — Other Ambulatory Visit: Payer: Self-pay

## 2020-07-27 ENCOUNTER — Ambulatory Visit: Payer: 59 | Admitting: Obstetrics and Gynecology

## 2020-07-27 ENCOUNTER — Other Ambulatory Visit: Payer: 59

## 2020-07-27 ENCOUNTER — Encounter: Payer: Self-pay | Admitting: Obstetrics and Gynecology

## 2020-07-27 VITALS — BP 110/80 | Ht 66.0 in | Wt 214.0 lb

## 2020-07-27 DIAGNOSIS — R319 Hematuria, unspecified: Secondary | ICD-10-CM

## 2020-07-27 DIAGNOSIS — R1032 Left lower quadrant pain: Secondary | ICD-10-CM

## 2020-07-27 LAB — POCT URINALYSIS DIPSTICK
Bilirubin, UA: NEGATIVE
Glucose, UA: NEGATIVE
Ketones, UA: NEGATIVE
Nitrite, UA: NEGATIVE
Protein, UA: NEGATIVE
Spec Grav, UA: 1.01 (ref 1.010–1.025)
pH, UA: 6.5 (ref 5.0–8.0)

## 2020-07-27 MED ORDER — NITROFURANTOIN MONOHYD MACRO 100 MG PO CAPS: 100.0000 mg | ORAL_CAPSULE | Freq: Two times a day (BID) | ORAL | 0 refills | Status: AC

## 2020-07-27 NOTE — Progress Notes (Signed)
Copland, Deirdre Evener, PA-C   Chief Complaint  Patient presents with  . Abdominal Pain    left lower abdominal, denies any uti sx    HPI:      Ms. Julie White is a 54 y.o. G0P0000 whose LMP was No LMP recorded. (Menstrual status: Perimenopausal)., presents today for crampy, intermittent LLQ pain since last wk. Sx have become more constant since yesterday and now tender to touch LLQ and flank. Improved with NSAIDs. No aggrav factors. Pt then developed sharp LT LBP this AM that radiated to RT low back for a couple minutes with urinary urgency and small flow. Pain severity caused episodic nausea. No GI, other urin or vag sx. No PMB (resolved from 3/21), had neg GYN u/s 3/21 with 2.6 cm leio, no ovar cysts. LMP ~2 yrs ago. She is sex active, no pain/bleeding. No fevers.  Past Medical History:  Diagnosis Date  . Anxiety   . BRCA negative 2014   BRCA/BART neg through [facility]; MyRisk update testing neg 6/20  . Depression   . Dysfunctional uterine bleeding   . Dysmenorrhea   . Eczema 02/27/2012  . Family history of breast cancer 04/2013, 6/20   BRCA neg/MyRisk neg  . Fibroid   . History of mammogram 05/15/2014 BI-RADS II  . History of Papanicolaou smear of cervix 05/13/2014  . Increased risk of breast cancer 05/2019   IBIS=24.4%/riskscore=26.1%  . Menorrhagia   . Vitamin D deficiency     Past Surgical History:  Procedure Laterality Date  . COLONOSCOPY WITH PROPOFOL N/A 09/18/2015   Procedure: COLONOSCOPY WITH PROPOFOL;  Surgeon: Josefine Class, MD;  Location: [facility] ENDOSCOPY;  Service: Endoscopy;  Laterality: N/A;  . DILATION AND CURETTAGE OF UTERUS  05/29/2008   DUB; FIBROIDS  . INTRAUTERINE DEVICE (IUD) INSERTION  05/01/2013   LAW  . TONSILLECTOMY      Family History  Problem Relation Age of Onset  . Parkinson's disease Father   . Diabetes Brother   . Breast cancer Maternal Aunt   . Breast cancer Maternal Aunt   . Breast cancer Cousin   . Breast cancer Cousin   .  Breast cancer Cousin   . Hypertension Neg Hx   . Cancer Neg Hx     Social History   Socioeconomic History  . Marital status: Married    Spouse name: Not on file  . Number of children: Not on file  . Years of education: Not on file  . Highest education level: Not on file  Occupational History  . Not on file  Tobacco Use  . Smoking status: Former Research scientist (life sciences)  . Smokeless tobacco: Never Used  Vaping Use  . Vaping Use: Never used  Substance and Sexual Activity  . Alcohol use: Yes    Comment: occ  . Drug use: No  . Sexual activity: Yes    Birth control/protection: None  Other Topics Concern  . Not on file  Social History Narrative  . Not on file   Social Determinants of Health   Financial Resource Strain:   . Difficulty of Paying Living Expenses: Not on file  Food Insecurity:   . Worried About Charity fundraiser in the Last Year: Not on file  . Ran Out of Food in the Last Year: Not on file  Transportation Needs:   . Lack of Transportation (Medical): Not on file  . Lack of Transportation (Non-Medical): Not on file  Physical Activity:   . Days of Exercise per  Week: Not on file  . Minutes of Exercise per Session: Not on file  Stress:   . Feeling of Stress : Not on file  Social Connections:   . Frequency of Communication with Friends and Family: Not on file  . Frequency of Social Gatherings with Friends and Family: Not on file  . Attends Religious Services: Not on file  . Active Member of Clubs or Organizations: Not on file  . Attends Archivist Meetings: Not on file  . Marital Status: Not on file  Intimate Partner Violence:   . Fear of Current or Ex-Partner: Not on file  . Emotionally Abused: Not on file  . Physically Abused: Not on file  . Sexually Abused: Not on file    Outpatient Medications Prior to Visit  Medication Sig Dispense Refill  . Cholecalciferol (VITAMIN D3) 2000 UNITS TABS Take 2,000 Units by mouth daily.    . citalopram (CELEXA) 40 MG  tablet Take 1 tablet (40 mg total) by mouth daily. 90 tablet 3  . hydrochlorothiazide (HYDRODIURIL) 12.5 MG tablet Take 1 tablet (12.5 mg total) by mouth daily. (Patient not taking: Reported on 05/08/2019) 90 tablet 3   No facility-administered medications prior to visit.      ROS:  Review of Systems  Constitutional: Negative for fever.  Gastrointestinal: Negative for blood in stool, constipation, diarrhea, nausea and vomiting.  Genitourinary: Positive for pelvic pain. Negative for dyspareunia, dysuria, flank pain, frequency, hematuria, urgency, vaginal bleeding, vaginal discharge and vaginal pain.  Musculoskeletal: Positive for back pain.  Skin: Negative for rash.    OBJECTIVE:   Vitals:  BP 110/80   Ht 5' 6" (1.676 m)   Wt 214 lb (97.1 kg)   BMI 34.54 kg/m   Physical Exam Vitals reviewed.  Constitutional:      Appearance: She is well-developed.  Pulmonary:     Effort: Pulmonary effort is normal.  Abdominal:     Palpations: Abdomen is soft.     Tenderness: There is abdominal tenderness in the left lower quadrant. There is no right CVA tenderness, left CVA tenderness, guarding or rebound.  Genitourinary:    General: Normal vulva.     Pubic Area: No rash.      Labia:        Right: No rash, tenderness or lesion.        Left: No rash, tenderness or lesion.      Vagina: Normal. No vaginal discharge, erythema or tenderness.     Cervix: Normal.     Uterus: Normal. Tender. Not enlarged.      Adnexa: Right adnexa normal.       Right: No mass or tenderness.         Left: Tenderness present. No mass.    Musculoskeletal:        General: Normal range of motion.     Cervical back: Normal range of motion.  Skin:    General: Skin is warm and dry.  Neurological:     General: No focal deficit present.     Mental Status: She is alert and oriented to person, place, and time.  Psychiatric:        Mood and Affect: Mood normal.        Behavior: Behavior normal.        Thought  Content: Thought content normal.        Judgment: Judgment normal.     Results: Results for orders placed or performed in visit on 07/27/20 (from the past  24 hour(s))  POCT Urinalysis Dipstick     Status: Abnormal   Collection Time: 07/27/20  2:45 PM  Result Value Ref Range   Color, UA yellow    Clarity, UA clear    Glucose, UA Negative Negative   Bilirubin, UA neg    Ketones, UA neg    Spec Grav, UA 1.010 1.010 - 1.025   Blood, UA mod    pH, UA 6.5 5.0 - 8.0   Protein, UA Negative Negative   Urobilinogen, UA     Nitrite, UA neg    Leukocytes, UA Small (1+) (A) Negative   Appearance     Odor       Assessment/Plan: LLQ pain--tender on abd and GYN exam. Blood on UA, no vag bleeding on exam. Treat for UTI. Rx macrobid, check C&S. Will f/u with results. If sx persist, will check GYN u/s again. Question kidney stone given back pains. F/u sooner prn.  Hematuria, unspecified type - Plan: nitrofurantoin, macrocrystal-monohydrate, (MACROBID) 100 MG capsule, Urine Culture, POCT Urinalysis Dipstick; If C&S neg, will recheck UA for blood.    Meds ordered this encounter  Medications  . nitrofurantoin, macrocrystal-monohydrate, (MACROBID) 100 MG capsule    Sig: Take 1 capsule (100 mg total) by mouth 2 (two) times daily for 5 days.    Dispense:  10 capsule    Refill:  0    Order Specific Question:   Supervising Provider    Answer:   Gae Dry [[id number]]      Return if symptoms worsen or fail to improve.  Alicia B. Copland, PA-C 07/27/2020 2:49 PM

## 2020-07-27 NOTE — Patient Instructions (Signed)
I value your feedback and entrusting us with your care. If you get a [facility] patient survey, I would appreciate you taking the time to let us know about your experience today. Thank you!  As of November 14, 2019, your lab results will be released to your MyChart immediately, before I even have a chance to see them. Please give me time to review them and contact you if there are any abnormalities. Thank you for your patience.  

## 2020-07-28 ENCOUNTER — Other Ambulatory Visit: Payer: Self-pay | Admitting: Obstetrics and Gynecology

## 2020-07-28 DIAGNOSIS — Z1231 Encounter for screening mammogram for malignant neoplasm of breast: Secondary | ICD-10-CM

## 2020-07-29 LAB — URINE CULTURE

## 2020-07-30 ENCOUNTER — Encounter: Payer: Self-pay | Admitting: Obstetrics and Gynecology

## 2020-08-05 ENCOUNTER — Ambulatory Visit: Payer: 59 | Admitting: Obstetrics and Gynecology

## 2020-08-14 ENCOUNTER — Other Ambulatory Visit: Payer: Self-pay

## 2020-08-14 ENCOUNTER — Ambulatory Visit (INDEPENDENT_AMBULATORY_CARE_PROVIDER_SITE_OTHER): Payer: 59

## 2020-08-14 DIAGNOSIS — R319 Hematuria, unspecified: Secondary | ICD-10-CM

## 2020-08-14 LAB — POCT URINALYSIS DIPSTICK
Appearance: NORMAL
Bilirubin, UA: NEGATIVE
Blood, UA: NEGATIVE
Glucose, UA: NEGATIVE
Ketones, UA: NEGATIVE
Leukocytes, UA: NEGATIVE
Nitrite, UA: NEGATIVE
Odor: NORMAL
Protein, UA: NEGATIVE
Spec Grav, UA: 1.015 (ref 1.010–1.025)
Urobilinogen, UA: 0.2 E.U./dL
pH, UA: 5 (ref 5.0–8.0)

## 2020-08-14 NOTE — Progress Notes (Signed)
Patient presents today for repeat urinalysis per Alicia's instructions. U/A results (neg) documented. Patient advised normal, no further treatment needed.

## 2020-08-21 ENCOUNTER — Ambulatory Visit
Admission: RE | Admit: 2020-08-21 | Discharge: 2020-08-21 | Disposition: A | Discharge status: Home / Self Care | Payer: 59 | Source: Ambulatory Visit | Attending: Obstetrics and Gynecology | Admitting: Obstetrics and Gynecology

## 2020-08-21 ENCOUNTER — Encounter: Payer: Self-pay | Admitting: Radiology

## 2020-08-21 DIAGNOSIS — Z1231 Encounter for screening mammogram for malignant neoplasm of breast: Secondary | ICD-10-CM | POA: Insufficient documentation

## 2020-08-25 NOTE — Patient Instructions (Signed)
I value your feedback and entrusting us with your care. If you get a [facility] patient survey, I would appreciate you taking the time to let us know about your experience today. Thank you!  As of November 14, 2019, your lab results will be released to your MyChart immediately, before I even have a chance to see them. Please give me time to review them and contact you if there are any abnormalities. Thank you for your patience.  

## 2020-08-25 NOTE — Progress Notes (Signed)
PCP: Chad Cordial, PA-C   Chief Complaint  Patient presents with  . Gynecologic Exam    HPI:      Julie White is a 54 y.o. G0P0000 who LMP was Patient's last menstrual period was 05/22/2018 (approximate)., presents today for her annual examination.  Her menses are absent due to menopause. Had episode of PMB 3/21 with neg u/s. No sx since. Will cont to follow.  She has occas mild vasomotor sx.   LLQ pain and LT LBP from 8/21 resolved. Pt thinks she passed kidney stone. Also with hematuria that resolved with recheck.  Sex activity: single partner, contraception - post menopausal status. She does have vaginal dryness and uses lubricants with relief.  Last Pap: January 03, 2017  Results were: no abnormalities /neg HPV DNA.  Hx of STDs: none  Last mammogram: 08/21/20 Results were: normal--routine follow-up in 12 months There is a FH of breast cancer in 2 mat aunts and 3 mat cousins. Pt is BRCA/BART neg 2014, MyRisk neg 6/20.  IBIS=24.4%/riskscore=26.1%. There is no FH of ovarian cancer. The patient does do self-breast exams. Hasn't had screening breast MRI yet. Is taking Vit D supp.  Colonoscopy: 2016 without abnormalities;  Repeat due after 10 years.   Tobacco use: The patient denies current or previous tobacco use. Alcohol use: social drinker  No drug use Exercise: min active  She does get adequate calcium and Vitamin D in her diet.  Has anxiety and takes citalopram 40 mg daily with sx relief. Has work/fam stressors. Needs Rx RF. Hx of HTN in past and took HCTZ in past, but stopped last yr and BP is normal.   Fasting labs last yr with borderline lipids. Due for recheck.    Past Medical History:  Diagnosis Date  . Anxiety   . BRCA negative 2014, 2020   BRCA/BART neg through [facility]; [facility] update testing neg 6/20  . Depression   . Dysfunctional uterine bleeding   . Dysmenorrhea   . Eczema 02/27/2012  . Family history of breast cancer 04/2013, 6/20   BRCA  neg/MyRisk neg  . Fibroid   . History of mammogram 05/15/2014 BI-RADS II  . History of Papanicolaou smear of cervix 05/13/2014  . Increased risk of breast cancer 05/2019   IBIS=24.4%/riskscore=26.1%  . Menorrhagia   . Vitamin D deficiency     Past Surgical History:  Procedure Laterality Date  . COLONOSCOPY WITH PROPOFOL N/A 09/18/2015   Procedure: COLONOSCOPY WITH PROPOFOL;  Surgeon: Josefine Class, MD;  Location: [facility] ENDOSCOPY;  Service: Endoscopy;  Laterality: N/A;  . DILATION AND CURETTAGE OF UTERUS  05/29/2008   DUB; FIBROIDS  . INTRAUTERINE DEVICE (IUD) INSERTION  05/01/2013   LAW  . TONSILLECTOMY      Family History  Problem Relation Age of Onset  . Parkinson's disease Father   . Diabetes Brother   . Breast cancer Maternal Aunt   . Breast cancer Maternal Aunt   . Breast cancer Cousin   . Breast cancer Cousin   . Breast cancer Cousin   . Hypertension Neg Hx   . Cancer Neg Hx     Social History   Socioeconomic History  . Marital status: Married    Spouse name: Not on file  . Number of children: Not on file  . Years of education: Not on file  . Highest education level: Not on file  Occupational History  . Not on file  Tobacco Use  . Smoking status: Former Research scientist (life sciences)  .  Smokeless tobacco: Never Used  Vaping Use  . Vaping Use: Never used  Substance and Sexual Activity  . Alcohol use: Yes    Comment: occ  . Drug use: No  . Sexual activity: Yes    Birth control/protection: Post-menopausal  Other Topics Concern  . Not on file  Social History Narrative  . Not on file   Social Determinants of Health   Financial Resource Strain:   . Difficulty of Paying Living Expenses: Not on file  Food Insecurity:   . Worried About Charity fundraiser in the Last Year: Not on file  . Ran Out of Food in the Last Year: Not on file  Transportation Needs:   . Lack of Transportation (Medical): Not on file  . Lack of Transportation (Non-Medical): Not on file  Physical  Activity:   . Days of Exercise per Week: Not on file  . Minutes of Exercise per Session: Not on file  Stress:   . Feeling of Stress : Not on file  Social Connections:   . Frequency of Communication with Friends and Family: Not on file  . Frequency of Social Gatherings with Friends and Family: Not on file  . Attends Religious Services: Not on file  . Active Member of Clubs or Organizations: Not on file  . Attends Archivist Meetings: Not on file  . Marital Status: Not on file  Intimate Partner Violence:   . Fear of Current or Ex-Partner: Not on file  . Emotionally Abused: Not on file  . Physically Abused: Not on file  . Sexually Abused: Not on file    Current Outpatient Medications on File Prior to Visit  Medication Sig Dispense Refill  . Cholecalciferol (VITAMIN D3) 2000 UNITS TABS Take 2,000 Units by mouth daily.     No current facility-administered medications on file prior to visit.    ROS:  Review of Systems  Constitutional: Negative for fatigue, fever and unexpected weight change.  Respiratory: Negative for cough, shortness of breath and wheezing.   Cardiovascular: Negative for chest pain, palpitations and leg swelling.  Gastrointestinal: Negative for blood in stool, constipation, diarrhea, nausea and vomiting.  Endocrine: Negative for cold intolerance, heat intolerance and polyuria.  Genitourinary: Negative for dyspareunia, dysuria, flank pain, frequency, genital sores, hematuria, menstrual problem, pelvic pain, urgency, vaginal bleeding, vaginal discharge and vaginal pain.  Musculoskeletal: Negative for arthralgias, back pain, joint swelling and myalgias.  Skin: Negative for rash.  Neurological: Negative for dizziness, syncope, light-headedness, numbness and headaches.  Hematological: Negative for adenopathy.  Psychiatric/Behavioral: Negative for agitation, confusion, sleep disturbance and suicidal ideas. The patient is not nervous/anxious.   BREAST: No  symptoms   Objective: BP 118/80   Ht 5' 6"  (1.676 m)   Wt 217 lb (98.4 kg)   LMP 05/22/2018 (Approximate)   BMI 35.02 kg/m    Physical Exam Constitutional:      Appearance: She is well-developed.  Genitourinary:     Vulva, vagina, cervix, uterus, right adnexa and left adnexa normal.     No vulval lesion or tenderness noted.     No vaginal discharge, erythema or tenderness.     No cervical polyp.     Uterus is not enlarged or tender.     No right or left adnexal mass present.     Right adnexa not tender.     Left adnexa not tender.  Neck:     Thyroid: No thyromegaly.  Cardiovascular:     Rate and Rhythm: Normal  rate and regular rhythm.     Heart sounds: Normal heart sounds. No murmur heard.   Pulmonary:     Effort: Pulmonary effort is normal.     Breath sounds: Normal breath sounds.  Chest:     Breasts:        Right: No mass, nipple discharge, skin change or tenderness.        Left: No mass, nipple discharge, skin change or tenderness.  Abdominal:     Palpations: Abdomen is soft.     Tenderness: There is no abdominal tenderness. There is no guarding.  Musculoskeletal:        General: Normal range of motion.     Cervical back: Normal range of motion.  Neurological:     General: No focal deficit present.     Mental Status: She is alert and oriented to person, place, and time.     Cranial Nerves: No cranial nerve deficit.  Skin:    General: Skin is warm and dry.  Psychiatric:        Mood and Affect: Mood normal.        Behavior: Behavior normal.        Thought Content: Thought content normal.        Judgment: Judgment normal.  Vitals reviewed.     Assessment/Plan:  Encounter for annual routine gynecological examination  Cervical cancer screening - Plan: Cytology - PAP  Screening for HPV (human papillomavirus) - Plan: Cytology - PAP  PMB (postmenopausal bleeding)--sx resolved, neg eval. F/u prn  Family history of breast cancer--Pt is MyRisk  neg.  Increased risk of breast cancer--pt aware of monthly SBE, yearly CBE and mammos, as well as scr breast MRI. Will call for breast MRI ref 1/22 if desires. Cont Vit D supp.   Anxiety - Sx controlled with citalopram. Rx RF. F/u prn.  - Plan: citalopram (CELEXA) 40 MG tablet  Blood tests for routine general physical examination - Plan: Comprehensive metabolic panel, Lipid panel, Lipid panel, Comprehensive metabolic panel  Screening cholesterol level - Plan: Lipid panel, Lipid panel   Meds ordered this encounter  Medications  . citalopram (CELEXA) 40 MG tablet    Sig: Take 1 tablet (40 mg total) by mouth daily.    Dispense:  90 tablet    Refill:  3    Order Specific Question:   Supervising Provider    Answer:   Gae Dry [[id number]]            GYN counsel breast self exam, mammography screening, menopause, adequate intake of calcium and vitamin D, diet and exercise    F/U  Return in about 1 year (around 08/26/2021).  Welcome Fults B. Alexys Gassett, PA-C 08/26/2020 11:51 AM

## 2020-08-26 ENCOUNTER — Encounter: Payer: Self-pay | Admitting: Obstetrics and Gynecology

## 2020-08-26 ENCOUNTER — Other Ambulatory Visit: Payer: Self-pay

## 2020-08-26 ENCOUNTER — Other Ambulatory Visit (HOSPITAL_COMMUNITY)
Admission: RE | Admit: 2020-08-26 | Discharge: 2020-08-26 | Disposition: A | Discharge status: Home / Self Care | Payer: 59 | Source: Ambulatory Visit | Attending: Obstetrics and Gynecology | Admitting: Obstetrics and Gynecology

## 2020-08-26 ENCOUNTER — Ambulatory Visit (INDEPENDENT_AMBULATORY_CARE_PROVIDER_SITE_OTHER): Payer: 59 | Admitting: Obstetrics and Gynecology

## 2020-08-26 VITALS — BP 118/80 | Ht 66.0 in | Wt 217.0 lb

## 2020-08-26 DIAGNOSIS — N950 Postmenopausal bleeding: Secondary | ICD-10-CM

## 2020-08-26 DIAGNOSIS — Z124 Encounter for screening for malignant neoplasm of cervix: Secondary | ICD-10-CM | POA: Insufficient documentation

## 2020-08-26 DIAGNOSIS — Z9189 Other specified personal risk factors, not elsewhere classified: Secondary | ICD-10-CM

## 2020-08-26 DIAGNOSIS — Z01419 Encounter for gynecological examination (general) (routine) without abnormal findings: Secondary | ICD-10-CM

## 2020-08-26 DIAGNOSIS — F419 Anxiety disorder, unspecified: Secondary | ICD-10-CM

## 2020-08-26 DIAGNOSIS — Z1151 Encounter for screening for human papillomavirus (HPV): Secondary | ICD-10-CM | POA: Diagnosis not present

## 2020-08-26 DIAGNOSIS — Z13220 Encounter for screening for lipoid disorders: Secondary | ICD-10-CM

## 2020-08-26 DIAGNOSIS — Z803 Family history of malignant neoplasm of breast: Secondary | ICD-10-CM

## 2020-08-26 DIAGNOSIS — Z0000 Encounter for general adult medical examination without abnormal findings: Secondary | ICD-10-CM

## 2020-08-26 MED ORDER — CITALOPRAM HYDROBROMIDE 40 MG PO TABS: 40.0000 mg | ORAL_TABLET | Freq: Every day | ORAL | 3 refills | Status: DC

## 2020-08-27 LAB — LIPID PANEL
Chol/HDL Ratio: 3.7 ratio (ref 0.0–4.4)
Cholesterol, Total: 211 mg/dL — ABNORMAL HIGH (ref 100–199)
HDL: 57 mg/dL (ref >39)
LDL Chol Calc (NIH): 139 mg/dL — ABNORMAL HIGH (ref 0–99)
Triglycerides: 84 mg/dL (ref 0–149)
VLDL Cholesterol Cal: 15 mg/dL (ref 5–40)

## 2020-08-27 LAB — COMPREHENSIVE METABOLIC PANEL WITH GFR
ALT: 14 IU/L (ref 0–32)
AST: 20 IU/L (ref 0–40)
Albumin/Globulin Ratio: 2.1 (ref 1.2–2.2)
Albumin: 4.5 g/dL (ref 3.8–4.9)
Alkaline Phosphatase: 80 IU/L (ref 44–121)
BUN/Creatinine Ratio: 25 — ABNORMAL HIGH (ref 9–23)
BUN: 13 mg/dL (ref 6–24)
Bilirubin Total: 0.3 mg/dL (ref 0.0–1.2)
CO2: 26 mmol/L (ref 20–29)
Calcium: 9.3 mg/dL (ref 8.7–10.2)
Chloride: 103 mmol/L (ref 96–106)
Creatinine, Ser: 0.51 mg/dL — ABNORMAL LOW (ref 0.57–1.00)
GFR calc Af Amer: 125 mL/min/1.73
GFR calc non Af Amer: 109 mL/min/1.73
Globulin, Total: 2.1 g/dL (ref 1.5–4.5)
Glucose: 78 mg/dL (ref 65–99)
Potassium: 4.3 mmol/L (ref 3.5–5.2)
Sodium: 141 mmol/L (ref 134–144)
Total Protein: 6.6 g/dL (ref 6.0–8.5)

## 2020-08-27 LAB — CYTOLOGY - PAP
Comment: NEGATIVE
Diagnosis: NEGATIVE
High risk HPV: NEGATIVE

## 2021-05-06 ENCOUNTER — Other Ambulatory Visit: Payer: Self-pay | Admitting: Obstetrics and Gynecology

## 2021-05-06 DIAGNOSIS — F419 Anxiety disorder, unspecified: Secondary | ICD-10-CM

## 2021-05-06 MED ORDER — CITALOPRAM HYDROBROMIDE 40 MG PO TABS: 40.0000 mg | ORAL_TABLET | Freq: Every day | ORAL | 0 refills | Status: DC

## 2021-05-06 NOTE — Progress Notes (Signed)
Rx RF citalopram to [facility].

## 2021-07-12 ENCOUNTER — Other Ambulatory Visit: Payer: Self-pay | Admitting: Obstetrics and Gynecology

## 2021-07-12 DIAGNOSIS — F419 Anxiety disorder, unspecified: Secondary | ICD-10-CM

## 2021-07-22 ENCOUNTER — Other Ambulatory Visit: Payer: Self-pay | Admitting: Obstetrics and Gynecology

## 2021-07-22 DIAGNOSIS — F419 Anxiety disorder, unspecified: Secondary | ICD-10-CM

## 2021-08-10 ENCOUNTER — Other Ambulatory Visit: Payer: Self-pay | Admitting: Obstetrics and Gynecology

## 2021-08-10 DIAGNOSIS — F419 Anxiety disorder, unspecified: Secondary | ICD-10-CM

## 2021-08-10 MED ORDER — CITALOPRAM HYDROBROMIDE 40 MG PO TABS: 40.0000 mg | ORAL_TABLET | Freq: Every day | ORAL | 0 refills | Status: DC

## 2021-09-21 ENCOUNTER — Ambulatory Visit (INDEPENDENT_AMBULATORY_CARE_PROVIDER_SITE_OTHER): Payer: 59 | Admitting: Obstetrics and Gynecology

## 2021-09-21 ENCOUNTER — Other Ambulatory Visit: Payer: Self-pay

## 2021-09-21 ENCOUNTER — Encounter: Payer: Self-pay | Admitting: Obstetrics and Gynecology

## 2021-09-21 VITALS — BP 140/90 | Ht 66.0 in | Wt 236.0 lb

## 2021-09-21 DIAGNOSIS — Z9189 Other specified personal risk factors, not elsewhere classified: Secondary | ICD-10-CM

## 2021-09-21 DIAGNOSIS — Z1231 Encounter for screening mammogram for malignant neoplasm of breast: Secondary | ICD-10-CM

## 2021-09-21 DIAGNOSIS — Z13220 Encounter for screening for lipoid disorders: Secondary | ICD-10-CM

## 2021-09-21 DIAGNOSIS — Z01419 Encounter for gynecological examination (general) (routine) without abnormal findings: Secondary | ICD-10-CM

## 2021-09-21 DIAGNOSIS — F419 Anxiety disorder, unspecified: Secondary | ICD-10-CM

## 2021-09-21 DIAGNOSIS — Z803 Family history of malignant neoplasm of breast: Secondary | ICD-10-CM | POA: Diagnosis not present

## 2021-09-21 DIAGNOSIS — Z0000 Encounter for general adult medical examination without abnormal findings: Secondary | ICD-10-CM

## 2021-09-21 MED ORDER — CITALOPRAM HYDROBROMIDE 40 MG PO TABS: 40.0000 mg | ORAL_TABLET | Freq: Every day | ORAL | 3 refills | Status: AC

## 2021-09-21 NOTE — Patient Instructions (Addendum)
I value your feedback and you entrusting us with your care. If you get a [facility] patient survey, I would appreciate you taking the time to let us know about your experience today. Thank you!  [facility]: [phone]      

## 2021-09-21 NOTE — Progress Notes (Signed)
PCP: Chad Cordial, PA-C   Chief Complaint  Patient presents with   Gynecologic Exam    No concerns    HPI:      Ms. Julie White is a 55 y.o. G0P0000 who LMP was Patient's last menstrual period was 05/22/2018 (approximate)., presents today for her annual examination.  Her menses are absent due to menopause. Had episode of PMB 3/21 with neg u/s. No sx since. She denies vasomotor sx.   Sex activity: single partner, contraception - post menopausal status. She does have vaginal dryness and uses lubricants with relief.  Last Pap: 08/26/20 Results were: no abnormalities /neg HPV DNA.  Hx of STDs: none  Last mammogram: 08/21/20 Results were: normal--routine follow-up in 12 months There is a FH of breast cancer in 2 mat aunts and 3 mat cousins. Pt is BRCA/BART neg 2014, MyRisk neg 6/20.  IBIS=24.4%/riskscore=26.1%. There is no FH of ovarian cancer. The patient does self-breast exams. Hasn't had screening breast MRI yet. Is taking Vit D supp.  Colonoscopy: 2016 without abnormalities;  Repeat due after 10 years.   Tobacco use: The patient denies current or previous tobacco use. Alcohol use: social drinker  No drug use Exercise: min active  She does get adequate calcium and Vitamin D in her diet.  Has anxiety and takes citalopram 40 mg daily with sx relief. Has work/fam stressors. Needs Rx RF. Also thinks she may have ADD. Seeing PA at [facility] and was started on ritalin without sx change. May just be extra stress  Hx of HTN in past and took HCTZ in past, but stopped 2 yrs ago and BP had been normal. Pt under lots of stress and has gained ~20# since last yr. BP has been normal at other MD appts.   Fasting labs last yr with borderline lipids. Due for recheck.    Past Medical History:  Diagnosis Date   Anxiety    BRCA negative 2014, 2020   BRCA/BART neg through [facility]; [facility] update testing neg 6/20   Depression    Dysfunctional uterine bleeding    Dysmenorrhea    Eczema 02/27/2012    Family history of breast cancer 04/2013, 6/20   BRCA neg/MyRisk neg   Fibroid    History of mammogram 05/15/2014 BI-RADS II   History of Papanicolaou smear of cervix 05/13/2014   Increased risk of breast cancer 05/2019   IBIS=24.4%/riskscore=26.1%   Menorrhagia    Vitamin D deficiency     Past Surgical History:  Procedure Laterality Date   COLONOSCOPY WITH PROPOFOL N/A 09/18/2015   Procedure: COLONOSCOPY WITH PROPOFOL;  Surgeon: Josefine Class, MD;  Location: [facility] ENDOSCOPY;  Service: Endoscopy;  Laterality: N/A;   DILATION AND CURETTAGE OF UTERUS  05/29/2008   DUB; FIBROIDS   INTRAUTERINE DEVICE (IUD) INSERTION  05/01/2013   LAW   TONSILLECTOMY      Family History  Problem Relation Age of Onset   Parkinson's disease Father    Diabetes Brother    Breast cancer Maternal Aunt    Breast cancer Maternal Aunt    Breast cancer Cousin    Breast cancer Cousin    Breast cancer Cousin    Hypertension Neg Hx    Cancer Neg Hx     Social History   Socioeconomic History   Marital status: Married    Spouse name: Not on file   Number of children: Not on file   Years of education: Not on file   Highest education level: Not on  file  Occupational History   Not on file  Tobacco Use   Smoking status: Former   Smokeless tobacco: Never  Vaping Use   Vaping Use: Never used  Substance and Sexual Activity   Alcohol use: Yes    Comment: occ   Drug use: No   Sexual activity: Yes    Birth control/protection: Post-menopausal  Other Topics Concern   Not on file  Social History Narrative   Not on file   Social Determinants of Health   Financial Resource Strain: Not on file  Food Insecurity: Not on file  Transportation Needs: Not on file  Physical Activity: Not on file  Stress: Not on file  Social Connections: Not on file  Intimate Partner Violence: Not on file    Current Outpatient Medications on File Prior to Visit  Medication Sig Dispense Refill   ascorbic acid  (VITAMIN C) 500 MG tablet Take by mouth.     Cholecalciferol (VITAMIN D3) 2000 UNITS TABS Take 2,000 Units by mouth daily.     methylphenidate (RITALIN) 10 MG tablet Take 10 mg by mouth daily.     No current facility-administered medications on file prior to visit.    ROS:  Review of Systems  Constitutional:  Negative for fatigue, fever and unexpected weight change.  Respiratory:  Negative for cough, shortness of breath and wheezing.   Cardiovascular:  Negative for chest pain, palpitations and leg swelling.  Gastrointestinal:  Negative for blood in stool, constipation, diarrhea, nausea and vomiting.  Endocrine: Negative for cold intolerance, heat intolerance and polyuria.  Genitourinary:  Negative for dyspareunia, dysuria, flank pain, frequency, genital sores, hematuria, menstrual problem, pelvic pain, urgency, vaginal bleeding, vaginal discharge and vaginal pain.  Musculoskeletal:  Negative for arthralgias, back pain, joint swelling and myalgias.  Skin:  Negative for rash.  Neurological:  Negative for dizziness, syncope, light-headedness, numbness and headaches.  Hematological:  Negative for adenopathy.  Psychiatric/Behavioral:  Negative for agitation, confusion, sleep disturbance and suicidal ideas. The patient is not nervous/anxious.  BREAST: No symptoms   Objective: BP 140/90   Ht 5' 6"  (1.676 m)   Wt 236 lb (107 kg)   LMP 05/22/2018 (Approximate)   BMI 38.09 kg/m    Physical Exam Constitutional:      Appearance: She is well-developed.  Genitourinary:     Vulva normal.     No vaginal discharge, erythema or tenderness.      Right Adnexa: not tender and no mass present.    Left Adnexa: not tender and no mass present.    No cervical polyp.     Uterus is not enlarged or tender.  Breasts:    Right: No mass, nipple discharge, skin change or tenderness.     Left: No mass, nipple discharge, skin change or tenderness.  Neck:     Thyroid: No thyromegaly.  Cardiovascular:      Rate and Rhythm: Normal rate and regular rhythm.     Heart sounds: Normal heart sounds. No murmur heard. Pulmonary:     Effort: Pulmonary effort is normal.     Breath sounds: Normal breath sounds.  Abdominal:     Palpations: Abdomen is soft.     Tenderness: There is no abdominal tenderness. There is no guarding.  Musculoskeletal:        General: Normal range of motion.     Cervical back: Normal range of motion.  Neurological:     General: No focal deficit present.     Mental Status: She is  alert and oriented to person, place, and time.     Cranial Nerves: No cranial nerve deficit.  Skin:    General: Skin is warm and dry.  Psychiatric:        Mood and Affect: Mood normal.        Behavior: Behavior normal.        Thought Content: Thought content normal.        Judgment: Judgment normal.  Vitals reviewed.    Assessment/Plan:  Encounter for annual routine gynecological examination  Encounter for screening mammogram for malignant neoplasm of breast - Plan: MM 3D SCREEN BREAST BILATERAL; pt to sched mammo  Family history of breast cancer--Pt is MyRisk neg.  Increased risk of breast cancer--pt aware of monthly SBE, yearly CBE and mammos, as well as scr breast MRI. Will call for breast MRI ref  if desires. Cont Vit D supp.   Anxiety - Sx controlled with citalopram. Rx RF. F/u prn.  - Plan: citalopram (CELEXA) 40 MG tablet  Blood tests for routine general physical examination - Plan: Comprehensive metabolic panel, Lipid panel, Lipid panel, Comprehensive metabolic panel  Screening cholesterol level - Plan: Lipid panel, Lipid panel  Elevated blood pressure reading--pt to check at home when relaxed or at other MD appts. F/u if <130/90 3 separate times. Can restart HCTZ prn.   Meds ordered this encounter  Medications   citalopram (CELEXA) 40 MG tablet    Sig: Take 1 tablet (40 mg total) by mouth daily.    Dispense:  90 tablet    Refill:  3    Order Specific Question:    Supervising Provider    Answer:   Gae Dry [[id number]]            GYN counsel breast self exam, mammography screening, menopause, adequate intake of calcium and vitamin D, diet and exercise    F/U  Return in about 1 year (around 09/21/2022).  Donovan Persley B. Darice Vicario, PA-C 09/21/2021 4:39 PM

## 2021-10-20 ENCOUNTER — Ambulatory Visit
Admission: RE | Admit: 2021-10-20 | Discharge: 2021-10-20 | Disposition: A | Discharge status: Home / Self Care | Payer: 59 | Source: Ambulatory Visit | Attending: Obstetrics and Gynecology | Admitting: Obstetrics and Gynecology

## 2021-10-20 ENCOUNTER — Other Ambulatory Visit: Payer: Self-pay

## 2021-10-20 DIAGNOSIS — Z1231 Encounter for screening mammogram for malignant neoplasm of breast: Secondary | ICD-10-CM | POA: Insufficient documentation

## 2021-10-20 DIAGNOSIS — Z9189 Other specified personal risk factors, not elsewhere classified: Secondary | ICD-10-CM | POA: Diagnosis present

## 2021-10-20 DIAGNOSIS — Z803 Family history of malignant neoplasm of breast: Secondary | ICD-10-CM | POA: Insufficient documentation

## 2023-05-08 IMAGING — MG MM DIGITAL SCREENING BILAT W/ TOMO AND CAD
6 of 10 series · 6 of 30 positions shown · non-contrast
Comparison: Previous exam(s).

CLINICAL DATA: Screening.

EXAM:
DIGITAL SCREENING BILATERAL MAMMOGRAM WITH TOMOSYNTHESIS AND CAD
TECHNIQUE: Bilateral screening digital craniocaudal and mediolateral oblique
mammograms were obtained. Bilateral screening digital breast
tomosynthesis was performed. The images were evaluated with
computer-aided detection.

[L MLO synth-2D (1 of 2)]
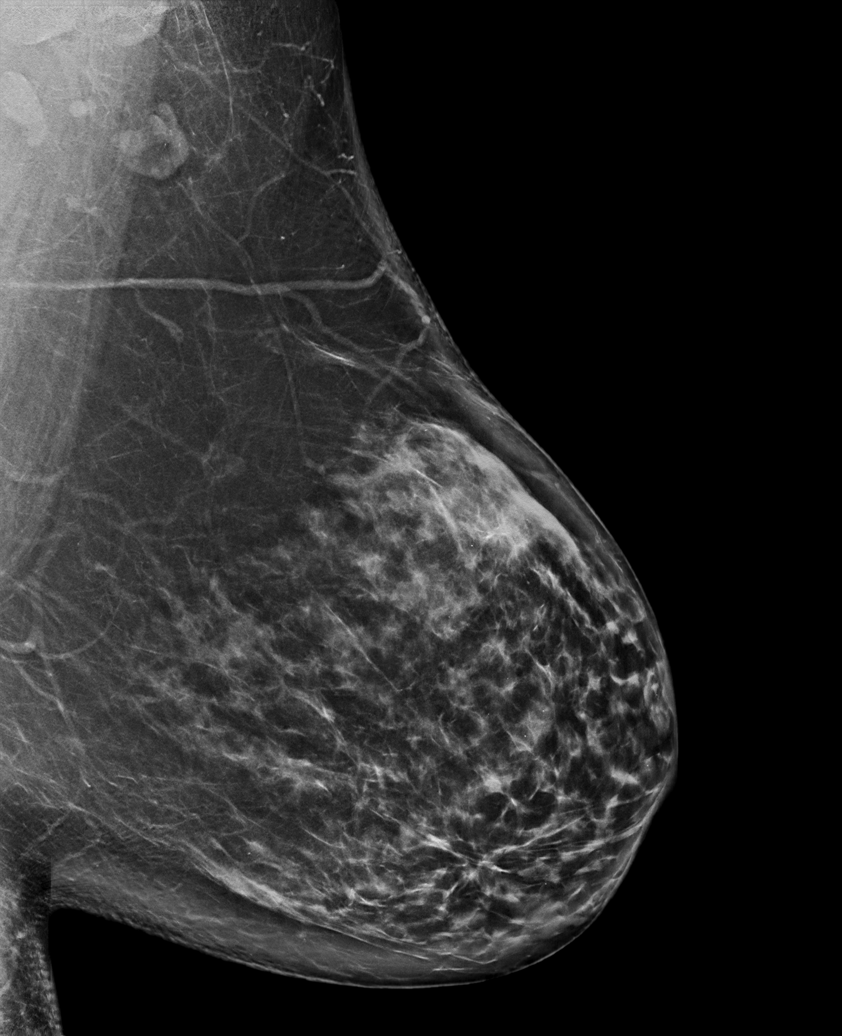

[L MLO synth-2D (2 of 2)]
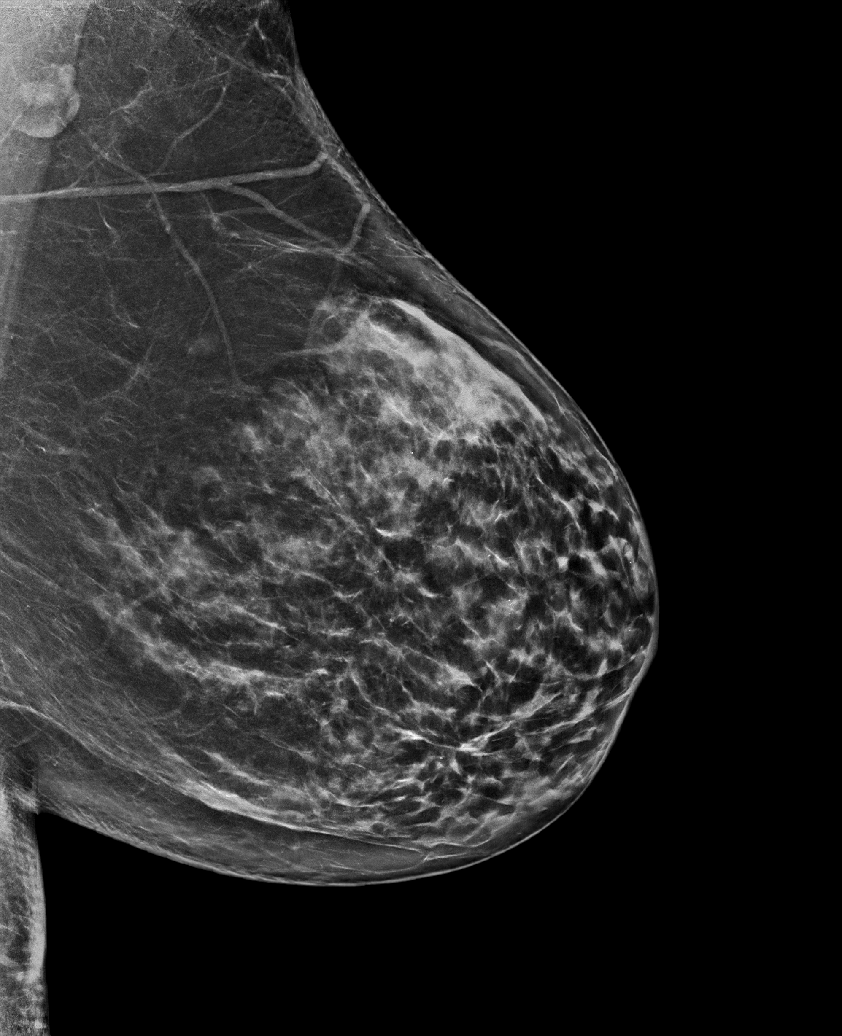

[L CC synth-2D]
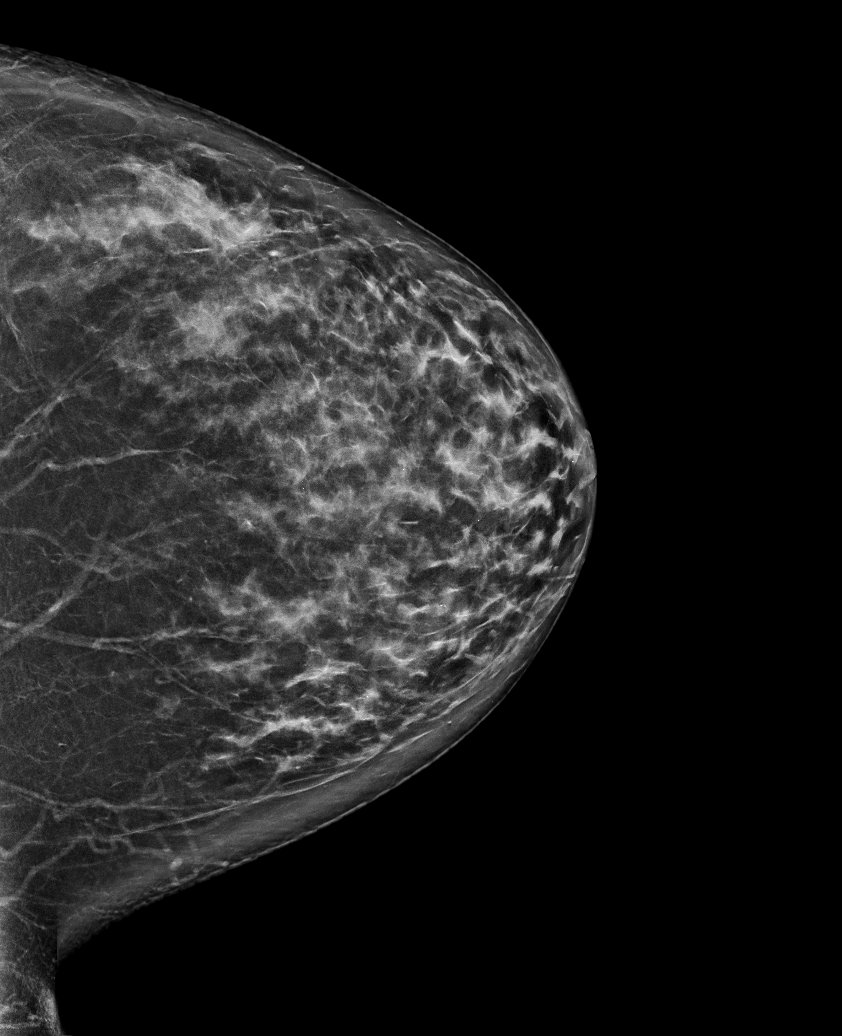

[R MLO synth-2D]
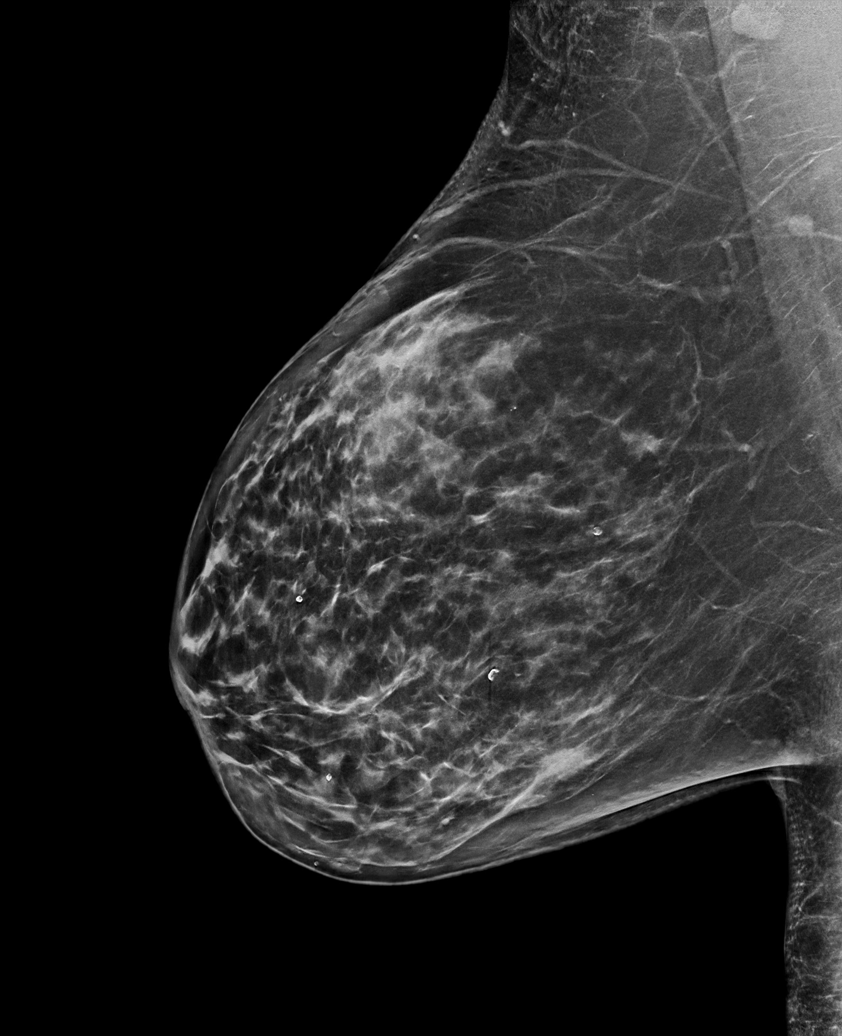

[R CC synth-2D]
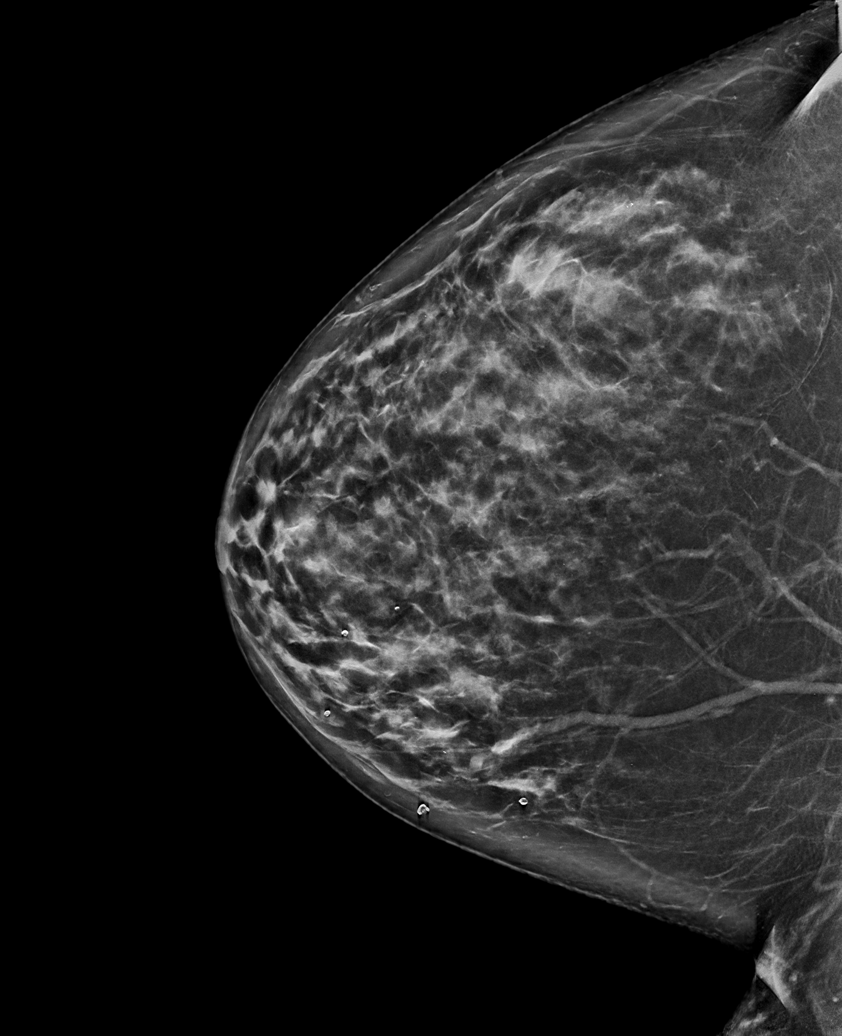

[L CC tomo · tomo slice 41/81.0]
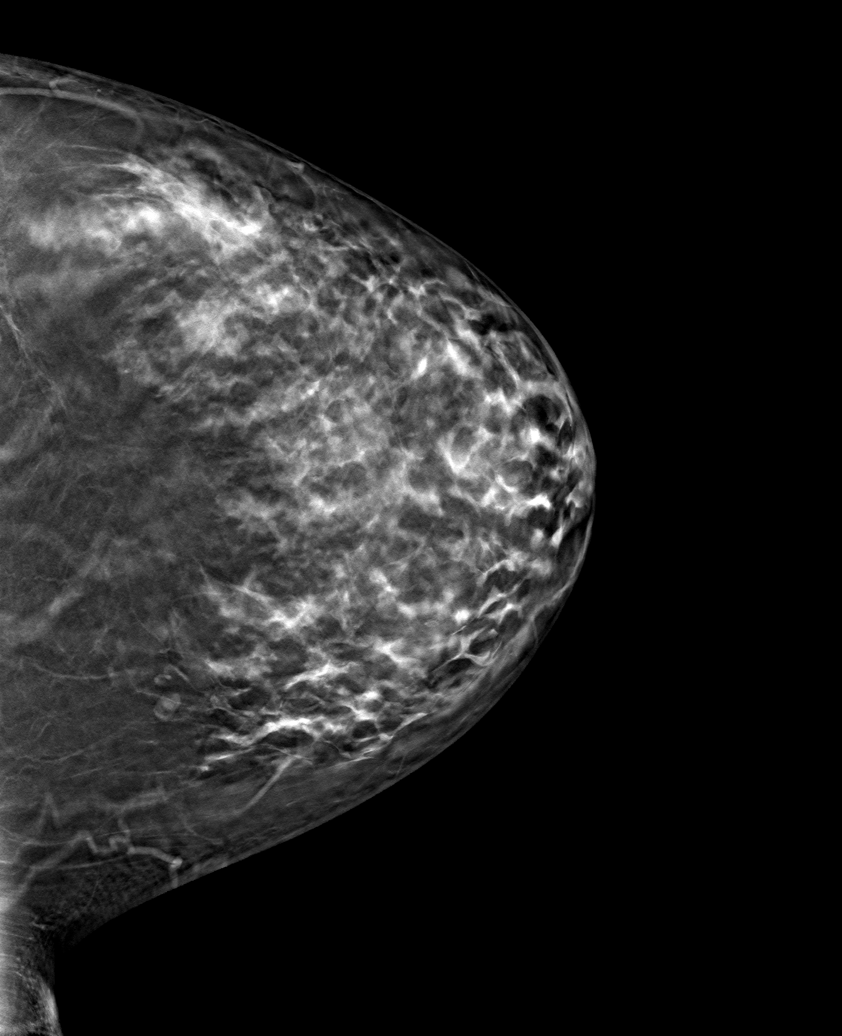

[6 of 30 positions shown; findings below may reference images not displayed]

ACR Breast Density Category c: The breast tissue is heterogeneously
dense, which may obscure small masses.
FINDINGS: There are no findings suspicious for malignancy.
IMPRESSION: No mammographic evidence of malignancy. A result letter of this
screening mammogram will be mailed directly to the patient.

RECOMMENDATION:
Screening mammogram in one year. (Code:Q3-W-BC3)

BI-RADS CATEGORY  1: Negative.
